# Patient Record
Sex: Male | Born: 1945 | Race: White | Hispanic: No | Marital: Married | Smoking: Former smoker
Health system: Southern US, Community
[De-identification: ages and names within clinical notes are randomized; demographics above are authoritative.]

## PROBLEM LIST (undated history)

## (undated) DIAGNOSIS — K219 Gastro-esophageal reflux disease without esophagitis: Secondary | ICD-10-CM

## (undated) HISTORY — PX: SHOULDER SURGERY: SHX246

## (undated) HISTORY — PX: COLONOSCOPY: SHX174

## (undated) HISTORY — PX: LEG SURGERY: SHX1003

---

## 2007-05-30 ENCOUNTER — Ambulatory Visit: Payer: Self-pay | Admitting: Gastroenterology

## 2010-01-31 ENCOUNTER — Emergency Department: Payer: Self-pay | Admitting: Emergency Medicine

## 2010-02-19 ENCOUNTER — Ambulatory Visit: Payer: Self-pay | Admitting: Orthopedic Surgery

## 2010-12-18 ENCOUNTER — Ambulatory Visit: Payer: Self-pay | Admitting: Gastroenterology

## 2016-03-19 DIAGNOSIS — K635 Polyp of colon: Secondary | ICD-10-CM | POA: Insufficient documentation

## 2016-07-08 ENCOUNTER — Encounter: Payer: Self-pay | Admitting: *Deleted

## 2016-07-09 ENCOUNTER — Ambulatory Visit: Payer: Commercial Managed Care - PPO | Admitting: Certified Registered Nurse Anesthetist

## 2016-07-09 ENCOUNTER — Encounter: Payer: Self-pay | Admitting: Certified Registered Nurse Anesthetist

## 2016-07-09 ENCOUNTER — Ambulatory Visit
Admission: RE | Admit: 2016-07-09 | Discharge: 2016-07-09 | Disposition: A | Discharge status: Home / Self Care | Payer: Commercial Managed Care - PPO | Source: Ambulatory Visit | Attending: Gastroenterology | Admitting: Gastroenterology

## 2016-07-09 ENCOUNTER — Encounter
Admission: RE | Disposition: A | Discharge status: Home / Self Care | Payer: Self-pay | Source: Ambulatory Visit | Attending: Gastroenterology

## 2016-07-09 DIAGNOSIS — K449 Diaphragmatic hernia without obstruction or gangrene: Secondary | ICD-10-CM | POA: Diagnosis not present

## 2016-07-09 DIAGNOSIS — K210 Gastro-esophageal reflux disease with esophagitis: Secondary | ICD-10-CM | POA: Diagnosis not present

## 2016-07-09 DIAGNOSIS — K2950 Unspecified chronic gastritis without bleeding: Secondary | ICD-10-CM | POA: Insufficient documentation

## 2016-07-09 DIAGNOSIS — K5730 Diverticulosis of large intestine without perforation or abscess without bleeding: Secondary | ICD-10-CM | POA: Diagnosis not present

## 2016-07-09 DIAGNOSIS — Z87891 Personal history of nicotine dependence: Secondary | ICD-10-CM | POA: Diagnosis not present

## 2016-07-09 DIAGNOSIS — Z86010 Personal history of colonic polyps: Secondary | ICD-10-CM | POA: Diagnosis not present

## 2016-07-09 DIAGNOSIS — Z6832 Body mass index (BMI) 32.0-32.9, adult: Secondary | ICD-10-CM | POA: Insufficient documentation

## 2016-07-09 DIAGNOSIS — K641 Second degree hemorrhoids: Secondary | ICD-10-CM | POA: Diagnosis not present

## 2016-07-09 DIAGNOSIS — Z800 Family history of malignant neoplasm of digestive organs: Secondary | ICD-10-CM | POA: Insufficient documentation

## 2016-07-09 DIAGNOSIS — K644 Residual hemorrhoidal skin tags: Secondary | ICD-10-CM | POA: Insufficient documentation

## 2016-07-09 DIAGNOSIS — R634 Abnormal weight loss: Secondary | ICD-10-CM | POA: Diagnosis not present

## 2016-07-09 HISTORY — PX: COLONOSCOPY WITH PROPOFOL: SHX5780

## 2016-07-09 HISTORY — DX: Gastro-esophageal reflux disease without esophagitis: K21.9

## 2016-07-09 HISTORY — PX: ESOPHAGOGASTRODUODENOSCOPY (EGD) WITH PROPOFOL: SHX5813

## 2016-07-09 SURGERY — COLONOSCOPY WITH PROPOFOL
Anesthesia: General

## 2016-07-09 MED ORDER — PROPOFOL 10 MG/ML IV BOLUS
INTRAVENOUS | Status: DC | PRN
  Administered 2016-07-09: 30 mg via INTRAVENOUS

## 2016-07-09 MED ORDER — ONDANSETRON HCL 4 MG/2ML IJ SOLN
INTRAMUSCULAR | Status: AC
  Filled 2016-07-09: qty 2

## 2016-07-09 MED ORDER — PROPOFOL 10 MG/ML IV BOLUS
INTRAVENOUS | Status: AC
  Filled 2016-07-09: qty 20

## 2016-07-09 MED ORDER — PROPOFOL 500 MG/50ML IV EMUL
INTRAVENOUS | Status: AC
  Filled 2016-07-09: qty 50

## 2016-07-09 MED ORDER — SODIUM CHLORIDE 0.9 % IV SOLN: INTRAVENOUS | Status: DC

## 2016-07-09 MED ORDER — SODIUM CHLORIDE 0.9 % IV SOLN
INTRAVENOUS | Status: DC
  Administered 2016-07-09: 08:00:00 via INTRAVENOUS

## 2016-07-09 MED ORDER — MIDAZOLAM HCL 2 MG/2ML IJ SOLN
INTRAMUSCULAR | Status: DC | PRN
  Administered 2016-07-09: 2 mg via INTRAVENOUS

## 2016-07-09 MED ORDER — MIDAZOLAM HCL 2 MG/2ML IJ SOLN
INTRAMUSCULAR | Status: AC
  Filled 2016-07-09: qty 2

## 2016-07-09 MED ORDER — ONDANSETRON HCL 4 MG/2ML IJ SOLN
INTRAMUSCULAR | Status: DC | PRN
  Administered 2016-07-09: 4 mg via INTRAVENOUS

## 2016-07-09 MED ORDER — PROPOFOL 500 MG/50ML IV EMUL
INTRAVENOUS | Status: DC | PRN
  Administered 2016-07-09: 140 ug/kg/min via INTRAVENOUS

## 2016-07-09 MED ORDER — LIDOCAINE HCL (CARDIAC) 20 MG/ML IV SOLN
INTRAVENOUS | Status: DC | PRN
  Administered 2016-07-09: 60 mg via INTRAVENOUS

## 2016-07-09 NOTE — H&P (Signed)
Outpatient short stay form Pre-procedure 07/09/2016 8:29 AM Lollie Sails MD  Primary Physician: [facility]  Reason for visit:  EGD and colonoscopy  History of present illness:  Patient is a 71 year old male presenting today as above. He had been having some issues with fair amount of reflux this being daily in nature. He had been placed recently on some omeprazole which is considerably improved his symptoms. She had some weight loss of about 5-10% of his body weight. He is presenting today for EGD and colonoscopy. He tolerated his prep well. He takes no aspirin or blood thinning agents. Patient does have a family history of colon cancer and cervical secondary relatives as well as personal history of colon polyps.    Current Facility-Administered Medications:  .  0.9 %  sodium chloride infusion, , Intravenous, Continuous, Lollie Sails, MD, Last Rate: 20 mL/hr at 07/09/16 0803 .  0.9 %  sodium chloride infusion, , Intravenous, Continuous, Lollie Sails, MD  Prescriptions Prior to Admission  Medication Sig Dispense Refill Last Dose  . omeprazole (PRILOSEC) 20 MG capsule Take 20 mg by mouth daily.   07/07/2016  . polyethylene glycol powder (GLYCOLAX/MIRALAX) powder Take 1 Container by mouth once.   07/08/2016 at Unknown time     No Known Allergies   Past Medical History:  Diagnosis Date  . GERD (gastroesophageal reflux disease)     Review of systems:      Physical Exam    Heart and lungs: Regular rate and rhythm without rub or gallop, lungs are bilaterally clear.    HEENT: Normocephalic atraumatic eyes are anicteric    Other:     Pertinant exam for procedure: Soft nontender nondistended bowel sounds positive normoactive.    Planned proceedures: EGD, colonoscopy and indicated procedures. I have discussed the risks benefits and complications of procedures to include not limited to bleeding, infection, perforation and the risk of sedation and the patient wishes to  proceed.    Lollie Sails, MD Gastroenterology 07/09/2016  8:29 AM

## 2016-07-09 NOTE — Op Note (Signed)
[facility] Gastroenterology Patient Name: Curtis Scott Procedure Date: 07/09/2016 8:33 AM MRN: [id number] Account #: [id number] Date of Birth: 1945/12/29 Admit Type: Outpatient Age: 71 Room: [facility] ENDO ROOM 3 Gender: Male Note Status: Finalized Procedure:            Colonoscopy Indications:          Family history of colon cancer in multiple                        second-degree relatives, Personal history of colonic                        polyps Providers:            Lollie Sails, MD Referring MD:         [place], MD (Referring MD) Medicines:            Monitored Anesthesia Care Complications:        No immediate complications. Procedure:            Pre-Anesthesia Assessment:                       - ASA Grade Assessment: II - A patient with mild                        systemic disease.                       After obtaining informed consent, the colonoscope was                        passed under direct vision. Throughout the procedure,                        the patient's blood pressure, pulse, and oxygen                        saturations were monitored continuously. The                        Colonoscope was introduced through the anus and                        advanced to the the cecum, identified by appendiceal                        orifice and ileocecal valve. The colonoscopy was                        performed with moderate difficulty due to poor bowel                        prep. Successful completion of the procedure was aided                        by lavage. The quality of the bowel preparation was                        good except the sigmoid colon was fair. Findings:      Multiple small and large-mouthed diverticula were found in the sigmoid  colon, descending colon, transverse colon and ascending colon.      Non-bleeding external and internal hemorrhoids were found during       retroflexion, during  digital exam and during anoscopy. The hemorrhoids       were small and Grade II (internal hemorrhoids that prolapse but reduce       spontaneously).      The exam was otherwise without abnormality.      The digital rectal exam was normal. Impression:           - Diverticulosis in the sigmoid colon, in the                        descending colon, in the transverse colon and in the                        ascending colon.                       - Non-bleeding external and internal hemorrhoids.                       - The examination was otherwise normal.                       - No specimens collected. Recommendation:       - Discharge patient to home. Procedure Code(s):    --- Professional ---                       [phone], Colonoscopy, flexible; diagnostic, including                        collection of specimen(s) by brushing or washing, when                        performed (separate procedure) Diagnosis Code(s):    --- Professional ---                       K64.1, Second degree hemorrhoids                       Z80.0, Family history of malignant neoplasm of                        digestive organs                       Z86.010, Personal history of colonic polyps                       K57.30, Diverticulosis of large intestine without                        perforation or abscess without bleeding CPT copyright 2016 American Medical Association. All rights reserved. The codes documented in this report are preliminary and upon coder review may  be revised to meet current compliance requirements. Lollie Sails, MD 07/09/2016 9:25:29 AM This report has been signed electronically. Number of Addenda: 0 Note Initiated On: 07/09/2016 8:33 AM Scope Withdrawal Time: 0 hours 10 minutes 43 seconds  Total Procedure Duration: 0 hours 22 minutes 57 seconds       [facility]

## 2016-07-09 NOTE — Anesthesia Preprocedure Evaluation (Addendum)
Anesthesia Evaluation  Patient identified by MRN, date of birth, ID band Patient awake    Reviewed: Allergy & Precautions, NPO status , Patient's Chart, lab work & pertinent test results  Airway Mallampati: III  TM Distance: >3 FB     Dental  (+) Upper Dentures, Chipped   Pulmonary former smoker,    Pulmonary exam normal        Cardiovascular negative cardio ROS Normal cardiovascular exam     Neuro/Psych negative neurological ROS  negative psych ROS   GI/Hepatic Neg liver ROS, GERD  Medicated,  Endo/Other  negative endocrine ROS  Renal/GU negative Renal ROS  negative genitourinary   Musculoskeletal negative musculoskeletal ROS (+)   Abdominal Normal abdominal exam  (+)   Peds negative pediatric ROS (+)  Hematology negative hematology ROS (+)   Anesthesia Other Findings Past Medical History: No date: GERD (gastroesophageal reflux disease)  Reproductive/Obstetrics                            Anesthesia Physical Anesthesia Plan  ASA: II  Anesthesia Plan: General   Post-op Pain Management:    Induction: Intravenous  Airway Management Planned: Nasal Cannula  Additional Equipment:   Intra-op Plan:   Post-operative Plan:   Informed Consent: I have reviewed the patients History and Physical, chart, labs and discussed the procedure including the risks, benefits and alternatives for the proposed anesthesia with the patient or authorized representative who has indicated his/her understanding and acceptance.   Dental advisory given  Plan Discussed with: CRNA and Surgeon  Anesthesia Plan Comments:         Anesthesia Quick Evaluation

## 2016-07-09 NOTE — Anesthesia Post-op Follow-up Note (Cosign Needed)
Anesthesia QCDR form completed.        

## 2016-07-09 NOTE — Anesthesia Postprocedure Evaluation (Signed)
Anesthesia Post Note  Patient: MASAO JUNKER  Procedure(s) Performed: Procedure(s) (LRB): COLONOSCOPY WITH PROPOFOL (N/A) ESOPHAGOGASTRODUODENOSCOPY (EGD) WITH PROPOFOL (N/A)  Patient location during evaluation: PACU Anesthesia Type: General Level of consciousness: awake and alert and oriented Pain management: pain level controlled Vital Signs Assessment: post-procedure vital signs reviewed and stable Respiratory status: spontaneous breathing Cardiovascular status: blood pressure returned to baseline Anesthetic complications: no     Last Vitals:  Vitals:   07/09/16 0957 07/09/16 1006  BP: (!) 114/96 126/88  Pulse: 81 71  Resp: 18 17  Temp:      Last Pain:  Vitals:   07/09/16 1006  TempSrc:   PainSc: 0-No pain                 Hester Joslin

## 2016-07-09 NOTE — Transfer of Care (Signed)
Immediate Anesthesia Transfer of Care Note  Patient: Curtis Scott  Procedure(s) Performed: Procedure(s): COLONOSCOPY WITH PROPOFOL (N/A) ESOPHAGOGASTRODUODENOSCOPY (EGD) WITH PROPOFOL (N/A)  Patient Location: PACU  Anesthesia Type:General  Level of Consciousness: sedated  Airway & Oxygen Therapy: Patient Spontanous Breathing and Patient connected to nasal cannula oxygen  Post-op Assessment: Report given to RN and Post -op Vital signs reviewed and stable  Post vital signs: Reviewed and stable  Last Vitals:  Vitals:   07/09/16 0746 07/09/16 0927  BP: 137/81 91/68  Pulse: 70 67  Resp: 18 18  Temp: (!) 35.9 C 36.1 C    Last Pain:  Vitals:   07/09/16 0927  TempSrc: Tympanic         Complications: No apparent anesthesia complications

## 2016-07-09 NOTE — Anesthesia Procedure Notes (Signed)
Date/Time: 07/09/2016 8:36 AM Performed by: Johnna Acosta Pre-anesthesia Checklist: Patient identified, Emergency Drugs available, Suction available, Patient being monitored and Timeout performed Patient Re-evaluated:Patient Re-evaluated prior to inductionOxygen Delivery Method: Nasal cannula

## 2016-07-09 NOTE — Op Note (Signed)
[facility] Gastroenterology Patient Name: Noemi Ishmael Procedure Date: 07/09/2016 8:34 AM MRN: [id number] Account #: [id number] Date of Birth: 09/09/1945 Admit Type: Outpatient Age: 71 Room: [facility] ENDO ROOM 3 Gender: Male Note Status: Finalized Procedure:            Upper GI endoscopy Indications:          Heartburn, Gastro-esophageal reflux disease Providers:            Lollie Sails, MD Referring MD:         [place], MD (Referring MD) Medicines:            Monitored Anesthesia Care Complications:        No immediate complications. Procedure:            Pre-Anesthesia Assessment:                       - ASA Grade Assessment: II - A patient with mild                        systemic disease.                       After obtaining informed consent, the endoscope was                        passed under direct vision. Throughout the procedure,                        the patient's blood pressure, pulse, and oxygen                        saturations were monitored continuously. The Endoscope                        was introduced through the mouth, and advanced to the                        third part of duodenum. The upper GI endoscopy was                        accomplished without difficulty. The patient tolerated                        the procedure well. Findings:      The Z-line was irregular. Biopsies were taken with a cold forceps for       histology.      A small hiatal hernia was found. The Z-line was a variable distance from       incisors; the hiatal hernia was sliding. There is some angulation of the       GE junction.      Diffuse mild inflammation characterized by congestion (edema) and       erythema was found in the entire examined stomach. Biopsies were taken       with a cold forceps for histology.      A single 2 mm mucosal papule (nodule) with no bleeding and no stigmata       of recent bleeding was found on the  posterior wall of the gastric       antrum. Biopsies were taken with a cold forceps for histology.  The examined duodenum was normal.      The cardia and gastric fundus were normal on retroflexion. Impression:           - Z-line irregular. Biopsied.                       - Small hiatal hernia.                       - Bile gastritis. Biopsied.                       - A single mucosal papule (nodule) found in the                        stomach. Biopsied.                       - Normal examined duodenum. Recommendation:       - Await pathology results.                       - Telephone GI clinic for pathology results in 1 week.                       - Continue present medications.                       - Use Prilosec (omeprazole) 20 mg PO daily daily. Procedure Code(s):    --- Professional ---                       [phone], Esophagogastroduodenoscopy, flexible, transoral;                        with biopsy, single or multiple Diagnosis Code(s):    --- Professional ---                       K22.8, Other specified diseases of esophagus                       K44.9, Diaphragmatic hernia without obstruction or                        gangrene                       K29.60, Other gastritis without bleeding                       K31.89, Other diseases of stomach and duodenum                       R12, Heartburn                       K21.9, Gastro-esophageal reflux disease without                        esophagitis CPT copyright 2016 American Medical Association. All rights reserved. The codes documented in this report are preliminary and upon coder review may  be revised to meet current compliance requirements. Lollie Sails, MD 07/09/2016 8:56:57 AM This report has been signed electronically. Number of Addenda: 0 Note Initiated On: 07/09/2016  8:34 AM      [facility]

## 2016-07-12 ENCOUNTER — Encounter: Payer: Self-pay | Admitting: Gastroenterology

## 2016-07-13 LAB — SURGICAL PATHOLOGY

## 2017-08-07 ENCOUNTER — Emergency Department: Payer: Commercial Managed Care - PPO

## 2017-08-07 ENCOUNTER — Other Ambulatory Visit: Payer: Self-pay

## 2017-08-07 ENCOUNTER — Encounter: Payer: Self-pay | Admitting: Emergency Medicine

## 2017-08-07 ENCOUNTER — Observation Stay
Admission: EM | Admit: 2017-08-07 | Discharge: 2017-08-10 | Disposition: A | Discharge status: Home / Self Care | Payer: Commercial Managed Care - PPO | Attending: Surgery | Admitting: Surgery

## 2017-08-07 DIAGNOSIS — K219 Gastro-esophageal reflux disease without esophagitis: Secondary | ICD-10-CM | POA: Insufficient documentation

## 2017-08-07 DIAGNOSIS — K760 Fatty (change of) liver, not elsewhere classified: Secondary | ICD-10-CM | POA: Insufficient documentation

## 2017-08-07 DIAGNOSIS — K449 Diaphragmatic hernia without obstruction or gangrene: Secondary | ICD-10-CM | POA: Insufficient documentation

## 2017-08-07 DIAGNOSIS — Z79899 Other long term (current) drug therapy: Secondary | ICD-10-CM | POA: Diagnosis not present

## 2017-08-07 DIAGNOSIS — N400 Enlarged prostate without lower urinary tract symptoms: Secondary | ICD-10-CM | POA: Insufficient documentation

## 2017-08-07 DIAGNOSIS — K8000 Calculus of gallbladder with acute cholecystitis without obstruction: Secondary | ICD-10-CM | POA: Diagnosis present

## 2017-08-07 DIAGNOSIS — K8012 Calculus of gallbladder with acute and chronic cholecystitis without obstruction: Principal | ICD-10-CM | POA: Insufficient documentation

## 2017-08-07 DIAGNOSIS — I700 Atherosclerosis of aorta: Secondary | ICD-10-CM | POA: Insufficient documentation

## 2017-08-07 DIAGNOSIS — Z87891 Personal history of nicotine dependence: Secondary | ICD-10-CM | POA: Diagnosis not present

## 2017-08-07 DIAGNOSIS — K819 Cholecystitis, unspecified: Secondary | ICD-10-CM | POA: Diagnosis present

## 2017-08-07 DIAGNOSIS — M47816 Spondylosis without myelopathy or radiculopathy, lumbar region: Secondary | ICD-10-CM | POA: Diagnosis not present

## 2017-08-07 LAB — COMPREHENSIVE METABOLIC PANEL
ALT: 28 U/L (ref 17–63)
ANION GAP: 11 (ref 5–15)
AST: 25 U/L (ref 15–41)
Albumin: 4.4 g/dL (ref 3.5–5.0)
Alkaline Phosphatase: 59 U/L (ref 38–126)
BILIRUBIN TOTAL: 1.1 mg/dL (ref 0.3–1.2)
BUN: 10 mg/dL (ref 6–20)
CHLORIDE: 101 mmol/L (ref 101–111)
CO2: 27 mmol/L (ref 22–32)
Calcium: 9.5 mg/dL (ref 8.9–10.3)
Creatinine, Ser: 0.82 mg/dL (ref 0.61–1.24)
Glucose, Bld: 114 mg/dL — ABNORMAL HIGH (ref 65–99)
POTASSIUM: 3.7 mmol/L (ref 3.5–5.1)
Sodium: 139 mmol/L (ref 135–145)
TOTAL PROTEIN: 8.1 g/dL (ref 6.5–8.1)

## 2017-08-07 LAB — URINALYSIS, ROUTINE W REFLEX MICROSCOPIC
Bilirubin Urine: NEGATIVE
GLUCOSE, UA: NEGATIVE mg/dL
Hgb urine dipstick: NEGATIVE
KETONES UR: NEGATIVE mg/dL
LEUKOCYTES UA: NEGATIVE
Nitrite: NEGATIVE
PROTEIN: NEGATIVE mg/dL
Specific Gravity, Urine: 1.011 (ref 1.005–1.030)
pH: 7 (ref 5.0–8.0)

## 2017-08-07 LAB — CBC
HEMATOCRIT: 46.3 % (ref 40.0–52.0)
Hemoglobin: 15.8 g/dL (ref 13.0–18.0)
MCH: 30.3 pg (ref 26.0–34.0)
MCHC: 34 g/dL (ref 32.0–36.0)
MCV: 89 fL (ref 80.0–100.0)
PLATELETS: 176 10*3/uL (ref 150–440)
RBC: 5.21 MIL/uL (ref 4.40–5.90)
RDW: 13.2 % (ref 11.5–14.5)
WBC: 14.5 10*3/uL — ABNORMAL HIGH (ref 3.8–10.6)

## 2017-08-07 LAB — LIPASE, BLOOD: LIPASE: 32 U/L (ref 11–51)

## 2017-08-07 MED ORDER — IOPAMIDOL (ISOVUE-300) INJECTION 61%
100.0000 mL | Freq: Once | INTRAVENOUS | Status: AC | PRN
  Administered 2017-08-07: 100 mL via INTRAVENOUS
  Filled 2017-08-07: qty 100

## 2017-08-07 MED ORDER — IOPAMIDOL (ISOVUE-300) INJECTION 61%
30.0000 mL | Freq: Once | INTRAVENOUS | Status: AC
  Administered 2017-08-07: 30 mL via ORAL
  Filled 2017-08-07: qty 30

## 2017-08-07 MED ORDER — MORPHINE SULFATE (PF) 4 MG/ML IV SOLN
4.0000 mg | Freq: Once | INTRAVENOUS | Status: AC
  Administered 2017-08-07: 4 mg via INTRAVENOUS
  Filled 2017-08-07: qty 1

## 2017-08-07 NOTE — ED Notes (Signed)
ED Provider at bedside. 

## 2017-08-07 NOTE — ED Triage Notes (Signed)
Pt arrives POV to triage with c/o constipation x 2 days. Pt reports hx of abdominal surgery x 2 years ago with complications of constipation since that time. Pt is in NAD.

## 2017-08-07 NOTE — ED Notes (Signed)
Patient transported to Ultrasound 

## 2017-08-07 NOTE — Progress Notes (Signed)
Chart reviewed, including imaging studies demonstrating acute calculous cholecystitis with WBC 14.5, discussed with ED physician, Dr. Kerman Passey. Considering few documented comorbidities and no antiplatelet or anticoagulant meds, will admit to surgical service, anticipate cholecystectomy. Full H&P to follow.  -- Marilynne Drivers Rosana Hoes, MD, [place]: [facility] - Partnering for exceptional care. Office: [phone]

## 2017-08-07 NOTE — ED Notes (Signed)
Patient transported to CT 

## 2017-08-07 NOTE — ED Provider Notes (Signed)
[facility] Emergency Department Provider Note  Time seen: 10:09 PM  I have reviewed the triage vital signs and the nursing notes.   HISTORY  Chief Complaint Constipation    HPI Curtis Scott is a 72 y.o. male with a past medical history of gastric reflux, presents to the emergency department for abdominal distention discomfort nausea vomiting.  According to the patient for the past several weeks he has been expensing intermittent abdominal discomfort, has been much more constant over the past 2 to 3 days along with constipation.  States today he began feeling very nauseated with several episodes of vomiting as well.  Denies any dysuria.  Denies hematuria.  Denies fever.  Patient states he had a colonoscopy and endoscopy performed approximately 7 months ago and since that time has had intermittent episodes of constipation and abdominal pain.  Describes his abdominal pain is an 8/10 currently.  Dull aching pain.   Past Medical History:  Diagnosis Date  . GERD (gastroesophageal reflux disease)     There are no active problems to display for this patient.   Past Surgical History:  Procedure Laterality Date  . COLONOSCOPY    . COLONOSCOPY WITH PROPOFOL N/A 07/09/2016   Procedure: COLONOSCOPY WITH PROPOFOL;  Surgeon: Lollie Sails, MD;  Location: [facility] ENDOSCOPY;  Service: Endoscopy;  Laterality: N/A;  . ESOPHAGOGASTRODUODENOSCOPY (EGD) WITH PROPOFOL N/A 07/09/2016   Procedure: ESOPHAGOGASTRODUODENOSCOPY (EGD) WITH PROPOFOL;  Surgeon: Lollie Sails, MD;  Location: [facility] ENDOSCOPY;  Service: Endoscopy;  Laterality: N/A;  . LEG SURGERY Right   . SHOULDER SURGERY Right     Prior to Admission medications   Medication Sig Start Date End Date Taking? Authorizing Provider  omeprazole (PRILOSEC) 20 MG capsule Take 20 mg by mouth daily.    [provider]  polyethylene glycol powder (GLYCOLAX/MIRALAX) powder Take 1 Container by mouth once.     [provider]    No Known Allergies  No family history on file.  Social History Social History   Tobacco Use  . Smoking status: Former Research scientist (life sciences)  . Smokeless tobacco: Never Used  Substance Use Topics  . Alcohol use: No  . Drug use: No    Review of Systems Constitutional: Negative for fever. Eyes: Negative for visual complaints ENT: Negative for recent illness/congestion Cardiovascular: Negative for chest pain. Respiratory: Negative for shortness of breath. Gastrointestinal: Abdominal discomfort, distention.  Positive for nausea vomiting.  Positive for constipation x2 to 3 days. Genitourinary: Negative for urinary compaints Musculoskeletal: Negative for musculoskeletal complaints Skin: Negative for skin complaints  Neurological: Negative for headache All other ROS negative  ____________________________________________   PHYSICAL EXAM:  VITAL SIGNS: ED Triage Vitals  Enc Vitals Group     BP 08/07/17 1914 134/66     Pulse Rate 08/07/17 1914 91     Resp 08/07/17 1914 (!) 24     Temp 08/07/17 1914 97.9 F (36.6 C)     Temp Source 08/07/17 1914 Oral     SpO2 08/07/17 1914 96 %     Weight 08/07/17 1914 230 lb (104.3 kg)     Height 08/07/17 1914 5\' 10"  (1.778 m)     Head Circumference --      Peak Flow --      Pain Score 08/07/17 1913 10     Pain Loc --      Pain Edu? --      Excl. in [place]? --    Constitutional: Alert and oriented. Well appearing and  in no distress. Eyes: Normal exam ENT   Head: Normocephalic and atraumatic.   Mouth/Throat: Mucous membranes are moist. Cardiovascular: Normal rate, regular rhythm. No murmur Respiratory: Normal respiratory effort without tachypnea nor retractions. Breath sounds are clear Gastrointestinal: Soft, mild abdominal distention.  Mild diffuse tenderness to palpation, tympanic percussion.  No rebound or guarding. Musculoskeletal: Nontender with normal range of motion in all extremities. Neurologic:  Normal  speech and language. No gross focal neurologic deficits Skin:  Skin is warm, dry and intact.  Psychiatric: Mood and affect are normal.   ____________________________________________   RADIOLOGY  IMPRESSION: 1. Gallstones with stone in the gallbladder neck and pericholecystic inflammation, highly suspicious for acute cholecystitis. 2. Equivocal wall thickening of the ascending and proximal transverse colon versus nondistention. 3. Enteric contrast in the distal esophagus may be delayed transit or reflux. Small hiatal hernia. 4. Mild hepatic steatosis. Enlarged prostate gland. 5. Aortic Atherosclerosis (ICD10-I70.0).  ____________________________________________   INITIAL IMPRESSION / ASSESSMENT AND PLAN / ED COURSE  Pertinent labs & imaging results that were available during my care of the patient were reviewed by me and considered in my medical decision making (see chart for details).  Patient presents to the emergency department for abdominal distention discomfort 2 to 3 days of constipation.  Patient is still able to pass gas.  Differential would include small bowel obstruction, partial bowel obstruction, gastroenteritis, ileus, infectious etiology.  We will check labs, obtain a CT scan, treat pain and nausea and closely monitor.  Overall the patient appears well.  Lab work has resulted showing a moderate leukocytosis of 14,000 otherwise largely within normal limits.  Mild diffuse tenderness on exam.  CT scan pending.  CT shows gallstones within the gallbladder neck and pericholecystic fluid likely acute cholecystitis will obtain an ultrasound to confirm.  I discussed the patient with Dr. Rosana Hoes of general surgery will be admitting to his service.  I discussed the results with the patient he is agreeable with this plan of care.  ____________________________________________   FINAL CLINICAL IMPRESSION(S) / ED DIAGNOSES  Cholecystitis    Harvest Dark, MD 08/07/17  2251

## 2017-08-08 ENCOUNTER — Encounter: Payer: Self-pay | Admitting: Anesthesiology

## 2017-08-08 ENCOUNTER — Observation Stay: Payer: Commercial Managed Care - PPO | Admitting: Certified Registered Nurse Anesthetist

## 2017-08-08 ENCOUNTER — Encounter
Admission: EM | Disposition: A | Discharge status: Home / Self Care | Payer: Self-pay | Source: Home / Self Care | Attending: Emergency Medicine

## 2017-08-08 DIAGNOSIS — K8000 Calculus of gallbladder with acute cholecystitis without obstruction: Secondary | ICD-10-CM | POA: Diagnosis not present

## 2017-08-08 DIAGNOSIS — K8012 Calculus of gallbladder with acute and chronic cholecystitis without obstruction: Secondary | ICD-10-CM | POA: Diagnosis not present

## 2017-08-08 HISTORY — PX: CHOLECYSTECTOMY: SHX55

## 2017-08-08 LAB — SURGICAL PCR SCREEN
MRSA, PCR: NEGATIVE
STAPHYLOCOCCUS AUREUS: NEGATIVE

## 2017-08-08 LAB — CBC
HEMATOCRIT: 40.8 % (ref 40.0–52.0)
HEMOGLOBIN: 14.2 g/dL (ref 13.0–18.0)
MCH: 31 pg (ref 26.0–34.0)
MCHC: 34.9 g/dL (ref 32.0–36.0)
MCV: 88.8 fL (ref 80.0–100.0)
Platelets: 140 10*3/uL — ABNORMAL LOW (ref 150–440)
RBC: 4.6 MIL/uL (ref 4.40–5.90)
RDW: 13.2 % (ref 11.5–14.5)
WBC: 9.9 10*3/uL (ref 3.8–10.6)

## 2017-08-08 LAB — COMPREHENSIVE METABOLIC PANEL
ALK PHOS: 47 U/L (ref 38–126)
ALT: 21 U/L (ref 17–63)
AST: 19 U/L (ref 15–41)
Albumin: 3.6 g/dL (ref 3.5–5.0)
Anion gap: 8 (ref 5–15)
BILIRUBIN TOTAL: 1 mg/dL (ref 0.3–1.2)
BUN: 11 mg/dL (ref 6–20)
CALCIUM: 8.2 mg/dL — AB (ref 8.9–10.3)
CO2: 25 mmol/L (ref 22–32)
CREATININE: 0.83 mg/dL (ref 0.61–1.24)
Chloride: 104 mmol/L (ref 101–111)
Glucose, Bld: 134 mg/dL — ABNORMAL HIGH (ref 65–99)
Potassium: 3.4 mmol/L — ABNORMAL LOW (ref 3.5–5.1)
SODIUM: 137 mmol/L (ref 135–145)
TOTAL PROTEIN: 6.5 g/dL (ref 6.5–8.1)

## 2017-08-08 SURGERY — LAPAROSCOPIC CHOLECYSTECTOMY
Anesthesia: General | Wound class: Clean Contaminated

## 2017-08-08 MED ORDER — PROPOFOL 10 MG/ML IV BOLUS
INTRAVENOUS | Status: AC
  Filled 2017-08-08: qty 20

## 2017-08-08 MED ORDER — ENOXAPARIN SODIUM 40 MG/0.4ML ~~LOC~~ SOLN
40.0000 mg | SUBCUTANEOUS | Status: DC
  Administered 2017-08-08 – 2017-08-09 (×3): 40 mg via SUBCUTANEOUS
  Filled 2017-08-08 (×3): qty 0.4

## 2017-08-08 MED ORDER — DEXTROSE IN LACTATED RINGERS 5 % IV SOLN
INTRAVENOUS | Status: DC
  Administered 2017-08-08: 01:00:00 via INTRAVENOUS

## 2017-08-08 MED ORDER — HYDROCODONE-ACETAMINOPHEN 5-325 MG PO TABS: 1.0000 | ORAL_TABLET | ORAL | Status: DC | PRN

## 2017-08-08 MED ORDER — SEVOFLURANE IN SOLN
RESPIRATORY_TRACT | Status: AC
  Filled 2017-08-08: qty 250

## 2017-08-08 MED ORDER — BUPIVACAINE-EPINEPHRINE (PF) 0.25% -1:200000 IJ SOLN
INTRAMUSCULAR | Status: DC | PRN
  Administered 2017-08-08: 30 mL via PERINEURAL

## 2017-08-08 MED ORDER — SODIUM CHLORIDE 0.9 % IV SOLN
2.0000 g | INTRAVENOUS | Status: DC
  Administered 2017-08-08: 2 g via INTRAVENOUS
  Filled 2017-08-08: qty 20
  Filled 2017-08-08: qty 2

## 2017-08-08 MED ORDER — LACTATED RINGERS IV SOLN
INTRAVENOUS | Status: DC
  Administered 2017-08-08 – 2017-08-09 (×2): via INTRAVENOUS

## 2017-08-08 MED ORDER — PROPOFOL 10 MG/ML IV BOLUS
INTRAVENOUS | Status: DC | PRN
  Administered 2017-08-08: 150 mg via INTRAVENOUS

## 2017-08-08 MED ORDER — LIDOCAINE HCL (CARDIAC) PF 100 MG/5ML IV SOSY
PREFILLED_SYRINGE | INTRAVENOUS | Status: DC | PRN
  Administered 2017-08-08: 100 mg via INTRAVENOUS

## 2017-08-08 MED ORDER — ONDANSETRON HCL 4 MG/2ML IJ SOLN
INTRAMUSCULAR | Status: AC
  Filled 2017-08-08: qty 2

## 2017-08-08 MED ORDER — MIDAZOLAM HCL 2 MG/2ML IJ SOLN
INTRAMUSCULAR | Status: AC
  Filled 2017-08-08: qty 2

## 2017-08-08 MED ORDER — DEXAMETHASONE SODIUM PHOSPHATE 10 MG/ML IJ SOLN
INTRAMUSCULAR | Status: AC
  Filled 2017-08-08: qty 1

## 2017-08-08 MED ORDER — KETOROLAC TROMETHAMINE 30 MG/ML IJ SOLN
INTRAMUSCULAR | Status: AC
  Filled 2017-08-08: qty 1

## 2017-08-08 MED ORDER — SODIUM CHLORIDE 0.9 % IV SOLN
3.0000 g | Freq: Four times a day (QID) | INTRAVENOUS | Status: DC
  Administered 2017-08-08 – 2017-08-10 (×7): 3 g via INTRAVENOUS
  Filled 2017-08-08 (×10): qty 3

## 2017-08-08 MED ORDER — ONDANSETRON HCL 4 MG/2ML IJ SOLN
4.0000 mg | Freq: Four times a day (QID) | INTRAMUSCULAR | Status: DC | PRN
  Administered 2017-08-08: 4 mg via INTRAVENOUS

## 2017-08-08 MED ORDER — FENTANYL CITRATE (PF) 100 MCG/2ML IJ SOLN
INTRAMUSCULAR | Status: DC | PRN
  Administered 2017-08-08: 50 ug via INTRAVENOUS

## 2017-08-08 MED ORDER — FENTANYL CITRATE (PF) 100 MCG/2ML IJ SOLN
INTRAMUSCULAR | Status: AC
  Filled 2017-08-08: qty 2

## 2017-08-08 MED ORDER — KETOROLAC TROMETHAMINE 30 MG/ML IJ SOLN
15.0000 mg | Freq: Once | INTRAMUSCULAR | Status: AC
  Administered 2017-08-08: 15 mg via INTRAVENOUS
  Filled 2017-08-08: qty 1

## 2017-08-08 MED ORDER — KETOROLAC TROMETHAMINE 30 MG/ML IJ SOLN
INTRAMUSCULAR | Status: DC | PRN
  Administered 2017-08-08: 15 mg via INTRAVENOUS

## 2017-08-08 MED ORDER — ONDANSETRON HCL 4 MG/2ML IJ SOLN: 4.0000 mg | Freq: Once | INTRAMUSCULAR | Status: DC | PRN

## 2017-08-08 MED ORDER — ONDANSETRON 4 MG PO TBDP: 4.0000 mg | ORAL_TABLET | Freq: Four times a day (QID) | ORAL | Status: DC | PRN

## 2017-08-08 MED ORDER — SUCCINYLCHOLINE CHLORIDE 20 MG/ML IJ SOLN
INTRAMUSCULAR | Status: AC
  Filled 2017-08-08: qty 1

## 2017-08-08 MED ORDER — SUCCINYLCHOLINE CHLORIDE 20 MG/ML IJ SOLN
INTRAMUSCULAR | Status: DC | PRN
  Administered 2017-08-08: 100 mg via INTRAVENOUS

## 2017-08-08 MED ORDER — MUPIROCIN 2 % EX OINT
1.0000 "application " | TOPICAL_OINTMENT | Freq: Two times a day (BID) | CUTANEOUS | Status: DC
  Filled 2017-08-08: qty 22

## 2017-08-08 MED ORDER — MORPHINE SULFATE (PF) 2 MG/ML IV SOLN
2.0000 mg | INTRAVENOUS | Status: DC | PRN
  Administered 2017-08-08: 2 mg via INTRAVENOUS
  Filled 2017-08-08: qty 1

## 2017-08-08 MED ORDER — BUPIVACAINE-EPINEPHRINE (PF) 0.25% -1:200000 IJ SOLN
INTRAMUSCULAR | Status: AC
  Filled 2017-08-08: qty 30

## 2017-08-08 MED ORDER — LACTATED RINGERS IV SOLN
INTRAVENOUS | Status: DC | PRN
  Administered 2017-08-08: 13:00:00 via INTRAVENOUS

## 2017-08-08 MED ORDER — FENTANYL CITRATE (PF) 100 MCG/2ML IJ SOLN: 25.0000 ug | INTRAMUSCULAR | Status: DC | PRN

## 2017-08-08 MED ORDER — ROCURONIUM BROMIDE 100 MG/10ML IV SOLN
INTRAVENOUS | Status: DC | PRN
  Administered 2017-08-08: 5 mg via INTRAVENOUS

## 2017-08-08 MED ORDER — MIDAZOLAM HCL 2 MG/2ML IJ SOLN
INTRAMUSCULAR | Status: DC | PRN
  Administered 2017-08-08: 1 mg via INTRAVENOUS

## 2017-08-08 MED ORDER — SUGAMMADEX SODIUM 200 MG/2ML IV SOLN
INTRAVENOUS | Status: AC
  Filled 2017-08-08: qty 2

## 2017-08-08 MED ORDER — MORPHINE SULFATE (PF) 2 MG/ML IV SOLN: 2.0000 mg | INTRAVENOUS | Status: DC | PRN

## 2017-08-08 MED ORDER — DEXAMETHASONE SODIUM PHOSPHATE 10 MG/ML IJ SOLN
INTRAMUSCULAR | Status: DC | PRN
  Administered 2017-08-08: 5 mg via INTRAVENOUS

## 2017-08-08 MED ORDER — FAMOTIDINE IN NACL 20-0.9 MG/50ML-% IV SOLN
20.0000 mg | Freq: Two times a day (BID) | INTRAVENOUS | Status: DC
  Administered 2017-08-08 – 2017-08-09 (×5): 20 mg via INTRAVENOUS
  Filled 2017-08-08 (×6): qty 50

## 2017-08-08 MED ORDER — LIDOCAINE HCL (PF) 2 % IJ SOLN
INTRAMUSCULAR | Status: AC
  Filled 2017-08-08: qty 10

## 2017-08-08 MED ORDER — ROCURONIUM BROMIDE 50 MG/5ML IV SOLN
INTRAVENOUS | Status: AC
  Filled 2017-08-08: qty 1

## 2017-08-08 SURGICAL SUPPLY — 43 items
ADHESIVE MASTISOL STRL (MISCELLANEOUS) ×2 IMPLANT
APPLIER CLIP ROT 10 11.4 M/L (STAPLE) ×2
BLADE SURG SZ11 CARB STEEL (BLADE) ×2 IMPLANT
CANISTER SUCT 1200ML W/VALVE (MISCELLANEOUS) ×2 IMPLANT
CATH CHOLANGI 4FR 420404F (CATHETERS) IMPLANT
CHLORAPREP W/TINT 26ML (MISCELLANEOUS) ×2 IMPLANT
CLIP APPLIE ROT 10 11.4 M/L (STAPLE) ×1 IMPLANT
CONRAY 60ML FOR OR (MISCELLANEOUS) IMPLANT
DRAPE C-ARM XRAY 36X54 (DRAPES) IMPLANT
ELECT REM PT RETURN 9FT ADLT (ELECTROSURGICAL) ×2
ELECTRODE REM PT RTRN 9FT ADLT (ELECTROSURGICAL) ×1 IMPLANT
GLOVE BIO SURGEON STRL SZ8 (GLOVE) ×2 IMPLANT
GOWN STRL REUS W/ TWL LRG LVL3 (GOWN DISPOSABLE) ×4 IMPLANT
GOWN STRL REUS W/TWL LRG LVL3 (GOWN DISPOSABLE) ×4
IRRIGATION STRYKERFLOW (MISCELLANEOUS) ×1 IMPLANT
IRRIGATOR STRYKERFLOW (MISCELLANEOUS) ×2
IV CATH ANGIO 12GX3 LT BLUE (NEEDLE) IMPLANT
IV NS 1000ML (IV SOLUTION) ×1
IV NS 1000ML BAXH (IV SOLUTION) ×1 IMPLANT
JACKSON PRATT 10 (INSTRUMENTS) ×2 IMPLANT
KIT TURNOVER KIT A (KITS) ×2 IMPLANT
LABEL OR SOLS (LABEL) ×2 IMPLANT
NEEDLE HYPO 22GX1.5 SAFETY (NEEDLE) ×2 IMPLANT
NEEDLE VERESS 14GA 120MM (NEEDLE) ×2 IMPLANT
NS IRRIG 500ML POUR BTL (IV SOLUTION) ×2 IMPLANT
PACK LAP CHOLECYSTECTOMY (MISCELLANEOUS) ×2 IMPLANT
POUCH SPECIMEN RETRIEVAL 10MM (ENDOMECHANICALS) ×2 IMPLANT
SCISSORS METZENBAUM CVD 33 (INSTRUMENTS) ×2 IMPLANT
SLEEVE ENDOPATH XCEL 5M (ENDOMECHANICALS) ×4 IMPLANT
SPONGE DRAIN TRACH 4X4 STRL 2S (GAUZE/BANDAGES/DRESSINGS) ×2 IMPLANT
SPONGE GAUZE 2X2 8PLY STRL LF (GAUZE/BANDAGES/DRESSINGS) ×8 IMPLANT
SPONGE LAP 18X18 RF (DISPOSABLE) ×2 IMPLANT
SPONGE VERSALON 4X4 4PLY (MISCELLANEOUS) IMPLANT
STRIP CLOSURE SKIN 1/2X4 (GAUZE/BANDAGES/DRESSINGS) ×2 IMPLANT
SUT ETHILON 3-0 (SUTURE) ×2 IMPLANT
SUT MNCRL 4-0 (SUTURE) ×1
SUT MNCRL 4-0 27XMFL (SUTURE) ×1
SUT VICRYL 0 AB UR-6 (SUTURE) ×2 IMPLANT
SUTURE MNCRL 4-0 27XMF (SUTURE) ×1 IMPLANT
SYR 20CC LL (SYRINGE) ×2 IMPLANT
TROCAR XCEL NON-BLD 11X100MML (ENDOMECHANICALS) ×2 IMPLANT
TROCAR XCEL NON-BLD 5MMX100MML (ENDOMECHANICALS) ×2 IMPLANT
TUBING INSUFFLATION (TUBING) ×2 IMPLANT

## 2017-08-08 NOTE — Progress Notes (Signed)
Preoperative Review   Patient is met in the preoperative holding area. The history is reviewed in the chart and with the patient. I personally reviewed the options and rationale as well as the risks of this procedure that have been previously discussed with the patient. All questions asked by the patient and/or family were answered to their satisfaction.  Patient agrees to proceed with this procedure at this time.  Richard E Cooper M.D. FACS  

## 2017-08-08 NOTE — Anesthesia Post-op Follow-up Note (Signed)
Anesthesia QCDR form completed.        

## 2017-08-08 NOTE — Progress Notes (Signed)
Patient sent to the OR via bed.  Wife is with him

## 2017-08-08 NOTE — Anesthesia Preprocedure Evaluation (Signed)
Anesthesia Evaluation  Patient identified by MRN, date of birth, ID band Patient awake    Reviewed: Allergy & Precautions, NPO status , Patient's Chart, lab work & pertinent test results  Airway Mallampati: III  TM Distance: >3 FB     Dental  (+) Upper Dentures, Chipped   Pulmonary former smoker,    Pulmonary exam normal        Cardiovascular negative cardio ROS Normal cardiovascular exam     Neuro/Psych negative neurological ROS  negative psych ROS   GI/Hepatic Neg liver ROS, GERD  Medicated,  Endo/Other  negative endocrine ROS  Renal/GU negative Renal ROS  negative genitourinary   Musculoskeletal negative musculoskeletal ROS (+)   Abdominal Normal abdominal exam  (+)   Peds negative pediatric ROS (+)  Hematology negative hematology ROS (+)   Anesthesia Other Findings Past Medical History: No date: GERD (gastroesophageal reflux disease)  Reproductive/Obstetrics                             Anesthesia Physical  Anesthesia Plan  ASA: II  Anesthesia Plan: General   Post-op Pain Management:    Induction: Intravenous  PONV Risk Score and Plan:   Airway Management Planned: Oral ETT  Additional Equipment:   Intra-op Plan:   Post-operative Plan: Extubation in OR  Informed Consent: I have reviewed the patients History and Physical, chart, labs and discussed the procedure including the risks, benefits and alternatives for the proposed anesthesia with the patient or authorized representative who has indicated his/her understanding and acceptance.   Dental advisory given  Plan Discussed with: CRNA and Surgeon  Anesthesia Plan Comments:         Anesthesia Quick Evaluation

## 2017-08-08 NOTE — Care Management Obs Status (Signed)
[place] NOTIFICATION   Patient Details  Name: MONTFORD BARG MRN: [id number] Date of Birth: [birth year]   Medicare Observation Status Notification Given:  Yes  Given to wife Edd Fabian at bedside since patient is out of surgery.  Akshay Spang A, RN 08/08/2017, 4:34 PM

## 2017-08-08 NOTE — Progress Notes (Signed)
Patient arrived from PACU

## 2017-08-08 NOTE — Care Management Note (Signed)
Case Management Note  Patient Details  Name: Curtis Scott MRN: [id number] Date of Birth: 1945/04/29  Subjective/Objective:   Patient admitted to [facility] under observation status. Patient had laparoscopic cholecystectomy. Spous Curtis Scott [phone] is at bedside and per the patient RNCM may obtain information from her as he has recently had surgery. Patient functions independently at home and is able to complete activities of daily living without issue. Currently uses no DME but does have a walker available if needed from previous knee surgery. No transportation limitations. Medications obtained from [facility] without issue. PCP is with the [facility].                 Action/Plan:  No anticipated RNCM needs. Will continue to follow.  Expected Discharge Date:                  Expected Discharge Plan:     In-House Referral:     Discharge planning Services     Post Acute Care Choice:    Choice offered to:     DME Arranged:    DME Agency:     HH Arranged:    HH Agency:     Status of Service:     If discussed at [employer], dates discussed:    Additional Comments:  Curtis Scott A, RN 08/08/2017, 4:35 PM

## 2017-08-08 NOTE — Anesthesia Postprocedure Evaluation (Signed)
Anesthesia Post Note  Patient: Curtis Scott  Procedure(s) Performed: LAPAROSCOPIC CHOLECYSTECTOMY (N/A )  Patient location during evaluation: PACU Anesthesia Type: General Level of consciousness: awake and alert and oriented Pain management: pain level controlled Vital Signs Assessment: post-procedure vital signs reviewed and stable Respiratory status: spontaneous breathing Cardiovascular status: blood pressure returned to baseline Anesthetic complications: no     Last Vitals:  Vitals:   08/08/17 1519 08/08/17 1534  BP: 110/64 111/64  Pulse: 87 80  Resp: 15 18  Temp:  36.5 C  SpO2: 98% 95%    Last Pain:  Vitals:   08/08/17 1534  TempSrc:   PainSc: 0-No pain                 Chloeann Alfred

## 2017-08-08 NOTE — Op Note (Signed)
Laparoscopic Cholecystectomy  Pre-operative Diagnosis: Acute cholecystitis  Post-operative Diagnosis: Acute cholecystitis with hydrops  Procedure: Laparoscopic cholecystectomy  Surgeon: Jerrol Banana. Burt Knack, MD FACS  Anesthesia: Gen. with endotracheal tube  Assistant: Surgical tech  Procedure Details  The patient was seen again in the Holding Room. The benefits, complications, treatment options, and expected outcomes were discussed with the patient. The risks of bleeding, infection, recurrence of symptoms, failure to resolve symptoms, bile duct damage, bile duct leak, retained common bile duct stone, bowel injury, any of which could require further surgery and/or ERCP, stent, or papillotomy were reviewed with the patient. The likelihood of improving the patient's symptoms with return to their baseline status is good.  The patient and/or family concurred with the proposed plan, giving informed consent.  The patient was taken to Operating Room, identified as Curtis Scott and the procedure verified as Laparoscopic Cholecystectomy.  A Time Out was held and the above information confirmed.  Prior to the induction of general anesthesia, antibiotic prophylaxis was administered. VTE prophylaxis was in place. General endotracheal anesthesia was then administered and tolerated well. After the induction, the abdomen was prepped with Chloraprep and draped in the sterile fashion. The patient was positioned in the supine position.  Local anesthetic  was injected into the skin near the umbilicus and an incision made. The Veress needle was placed. Pneumoperitoneum was then created with CO2 and tolerated well without any adverse changes in the patient's vital signs. A 36mm port was placed in the periumbilical position and the abdominal cavity was explored.  Two 5-mm ports were placed in the right upper quadrant and a 12 mm epigastric port was placed all under direct vision. All skin incisions  were infiltrated  with a local anesthetic agent before making the incision and placing the trocars.   The patient was positioned  in reverse Trendelenburg, tilted slightly to the patient's left.  The gallbladder was identified, the fundus grasped and retracted cephalad. Adhesions were lysed bluntly. The infundibulum was grasped and retracted laterally, exposing the peritoneum overlying the triangle of Calot. This was then divided and exposed in a blunt fashion.  There was a large amount of scar and edema in this area and it was clear that the thickened gallbladder wall was result of an impacted stone in the infundibulum of the gallbladder.  Because the infundibulum of the gallbladder and the impacted stone appeared very close to what was believed to be the common bile duct a portion of the dissection was performed from the midportion or more fundal portion of the gallbladder down to the cystic duct.  This allowed for good visualization that there was no structure behind the gallbladder suggestive of a common hepatic duct.  Small vessels were doubly clipped or singly clipped and divided.  A critical view of the cystic duct and cystic artery was obtained.  The cystic duct was clearly identified and bluntly dissected.   The cystic artery was doubly clipped and divided and then the cystic duct was doubly clipped just below the impacted stone and divided.  A large stone was visualized in the cystic duct infundibular junction.  (The gallbladder was inspected on the back table after passing it out through the epigastric port site with the aid of an Endo Catch bag there was no sign of bile duct injury etc.  The gallbladder wall was extremely thick and scar of fied)  The gallbladder was taken from the gallbladder fossa in a retrograde fashion with the electrocautery. The gallbladder was  removed and placed in an Endocatch bag. The liver bed was irrigated and inspected. Hemostasis was achieved with the electrocautery. Copious irrigation  was utilized and was repeatedly aspirated until clear.  The gallbladder and Endocatch sac were then removed through the epigastric port site.   A 10 mm drain was placed into the foramen of Winslow and brought out through a lateral port site it was held in with 3-0 nylon.  Inspection of the right upper quadrant was performed. No bleeding, bile duct injury or leak, or bowel injury was noted. Pneumoperitoneum was released.  The epigastric port site was closed with figure-of-eight 0 Vicryl sutures. 4-0 subcuticular Monocryl was used to close the skin. Steristrips and Mastisol and sterile dressings were  applied.  The patient was then extubated and brought to the recovery room in stable condition. Sponge, lap, and needle counts were correct at closure and at the conclusion of the case.   Findings: Acute cholecystitis with hydrops  Estimated Blood Loss: 50 cc         Drains: JP x1         Specimens: Gallbladder           Complications: none               Koraima Albertsen E. Burt Knack, MD, FACS

## 2017-08-08 NOTE — Transfer of Care (Signed)
Immediate Anesthesia Transfer of Care Note  Patient: Curtis Scott  Procedure(s) Performed: LAPAROSCOPIC CHOLECYSTECTOMY (N/A )  Patient Location: PACU  Anesthesia Type:General  Level of Consciousness: awake  Airway & Oxygen Therapy: Patient Spontanous Breathing  Post-op Assessment: Report given to RN  Post vital signs: stable  Last Vitals:  Vitals Value Taken Time  BP 121/66 08/08/2017  2:49 PM  Temp 36.6 C 08/08/2017  2:49 PM  Pulse 92 08/08/2017  2:52 PM  Resp 14 08/08/2017  2:51 PM  SpO2 94 % 08/08/2017  2:52 PM  Vitals shown include unvalidated device data.  Last Pain:  Vitals:   08/08/17 1449  TempSrc:   PainSc: (P) Asleep         Complications: No apparent anesthesia complications

## 2017-08-08 NOTE — Anesthesia Procedure Notes (Signed)
Procedure Name: Intubation Date/Time: 08/08/2017 1:34 PM Performed by: Zetta Bills, CRNA Pre-anesthesia Checklist: Patient identified, Emergency Drugs available, Suction available and Patient being monitored Patient Re-evaluated:Patient Re-evaluated prior to induction Oxygen Delivery Method: Circle system utilized Preoxygenation: Pre-oxygenation with 100% oxygen Induction Type: IV induction Laryngoscope Size: Mac and 3 Grade View: Grade I Tube type: Oral Tube size: 7.0 mm Number of attempts: 1 Airway Equipment and Method: Stylet

## 2017-08-08 NOTE — H&P (Signed)
Curtis Scott is an 72 y.o. male.    Chief Complaint: Right upper quadrant abdominal pain  HPI: This is a patient with 3 or 4 episodes in the last 6 months of right upper quadrant abdominal pain he believes is related to his prior EGD and colonoscopy however.  He has known gallstones and has been worked up suggestive of early acute cholecystitis.  This morning he feels better has no nausea vomiting fevers or chills.  He denies taking any medicines except an over-the-counter antiacid.  He denies hypertension cardiac disease or pulmonary disease he was a smoker and drinker stopped drinking in the 90s and stopped smoking 20 years ago.  He is a retired Administrator.  He does not know of any family history significant for gallbladder disease.  Past Medical History:  Diagnosis Date  . GERD (gastroesophageal reflux disease)     Past Surgical History:  Procedure Laterality Date  . COLONOSCOPY    . COLONOSCOPY WITH PROPOFOL N/A 07/09/2016   Procedure: COLONOSCOPY WITH PROPOFOL;  Surgeon: Lollie Sails, MD;  Location: [facility] ENDOSCOPY;  Service: Endoscopy;  Laterality: N/A;  . ESOPHAGOGASTRODUODENOSCOPY (EGD) WITH PROPOFOL N/A 07/09/2016   Procedure: ESOPHAGOGASTRODUODENOSCOPY (EGD) WITH PROPOFOL;  Surgeon: Lollie Sails, MD;  Location: [facility] ENDOSCOPY;  Service: Endoscopy;  Laterality: N/A;  . LEG SURGERY Right   . SHOULDER SURGERY Right     No family history on file. Social History:  reports that he has quit smoking. He has never used smokeless tobacco. He reports that he does not drink alcohol or use drugs.  Allergies: No Known Allergies  Medications Prior to Admission  Medication Sig Dispense Refill  . omeprazole (PRILOSEC) 20 MG capsule Take 20 mg by mouth daily.       Review of Systems  Constitutional: Negative for chills and fever.  HENT: Negative.   Eyes: Negative.   Respiratory: Negative.   Cardiovascular: Negative.   Gastrointestinal: Positive for abdominal pain and  heartburn. Negative for blood in stool, constipation, diarrhea, nausea and vomiting.  Genitourinary: Negative.   Musculoskeletal: Negative.   Skin: Negative.   Neurological: Negative.   Endo/Heme/Allergies: Negative.   Psychiatric/Behavioral: Negative.      Physical Exam:  BP 119/69 (BP Location: Left Arm)   Pulse 81   Temp 98.1 F (36.7 C) (Oral)   Resp 18   Ht '5\' 10"'  (1.778 m)   Wt 225 lb (102.1 kg)   SpO2 97%   BMI 32.28 kg/m   Physical Exam  Constitutional: He is oriented to person, place, and time. He appears well-developed and well-nourished. No distress.  HENT:  Head: Normocephalic and atraumatic.  Eyes: Pupils are equal, round, and reactive to light. EOM are normal. Right eye exhibits no discharge. Left eye exhibits no discharge. No scleral icterus.  Neck: Normal range of motion. Neck supple.  Cardiovascular: Normal rate, regular rhythm and normal heart sounds.  Pulmonary/Chest: Effort normal and breath sounds normal. No respiratory distress.  Abdominal: Soft. He exhibits no distension and no mass. There is tenderness. There is no guarding.  Tenderness right upper quadrant with questionable Murphy sign  Musculoskeletal: Normal range of motion. He exhibits no edema or deformity.  Neurological: He is alert and oriented to person, place, and time.  Skin: Skin is warm and dry. He is not diaphoretic. No erythema.  Psychiatric: He has a normal mood and affect.  Vitals reviewed.       Results for orders placed or performed during the hospital encounter of  08/07/17 (from the past 48 hour(s))  CBC     Status: Abnormal   Collection Time: 08/07/17  8:47 PM  Result Value Ref Range   WBC 14.5 (H) 3.8 - 10.6 K/uL   RBC 5.21 4.40 - 5.90 MIL/uL   Hemoglobin 15.8 13.0 - 18.0 g/dL   HCT 46.3 40.0 - 52.0 %   MCV 89.0 80.0 - 100.0 fL   MCH 30.3 26.0 - 34.0 pg   MCHC 34.0 32.0 - 36.0 g/dL   RDW 13.2 11.5 - 14.5 %   Platelets 176 150 - 440 K/uL    Comment: Performed at  [facility], [address] [place], [place] [address]  Comprehensive metabolic panel     Status: Abnormal   Collection Time: 08/07/17  8:47 PM  Result Value Ref Range   Sodium 139 135 - 145 mmol/L   Potassium 3.7 3.5 - 5.1 mmol/L   Chloride 101 101 - 111 mmol/L   CO2 27 22 - 32 mmol/L   Glucose, Bld 114 (H) 65 - 99 mg/dL   BUN 10 6 - 20 mg/dL   Creatinine, Ser 0.82 0.61 - 1.24 mg/dL   Calcium 9.5 8.9 - 10.3 mg/dL   Total Protein 8.1 6.5 - 8.1 g/dL   Albumin 4.4 3.5 - 5.0 g/dL   AST 25 15 - 41 U/L   ALT 28 17 - 63 U/L   Alkaline Phosphatase 59 38 - 126 U/L   Total Bilirubin 1.1 0.3 - 1.2 mg/dL   GFR calc non Af Amer >60 >60 mL/min   GFR calc Af Amer >60 >60 mL/min    Comment: (NOTE) The eGFR has been calculated using the CKD EPI equation. This calculation has not been validated in all clinical situations. eGFR's persistently <60 mL/min signify possible Chronic Kidney Disease.    Anion gap 11 5 - 15    Comment: Performed at [facility], [address] [place], [place] [address]  Lipase, blood     Status: None   Collection Time: 08/07/17  8:47 PM  Result Value Ref Range   Lipase 32 11 - 51 U/L    Comment: Performed at [facility], [address] [place], [place] [address]  Urinalysis, Routine w reflex microscopic     Status: Abnormal   Collection Time: 08/07/17 10:10 PM  Result Value Ref Range   Color, Urine YELLOW (A) YELLOW   APPearance HAZY (A) CLEAR   Specific Gravity, Urine 1.011 1.005 - 1.030   pH 7.0 5.0 - 8.0   Glucose, UA NEGATIVE NEGATIVE mg/dL   Hgb urine dipstick NEGATIVE NEGATIVE   Bilirubin Urine NEGATIVE NEGATIVE   Ketones, ur NEGATIVE NEGATIVE mg/dL   Protein, ur NEGATIVE NEGATIVE mg/dL   Nitrite NEGATIVE NEGATIVE   Leukocytes, UA NEGATIVE NEGATIVE    Comment: Performed at [facility], [address], [place], [place] [address]  Surgical PCR screen     Status: None   Collection Time: 08/08/17  1:23 AM   Result Value Ref Range   MRSA, PCR NEGATIVE NEGATIVE   Staphylococcus aureus NEGATIVE NEGATIVE    Comment: (NOTE) The Xpert SA Assay (FDA approved for NASAL specimens in patients 38 years of age and older), is one component of a comprehensive surveillance program. It is not intended to diagnose infection nor to guide or monitor treatment. Performed at [facility], [address] [place], [place] [address]   Comprehensive metabolic panel     Status: Abnormal  Collection Time: 08/08/17  4:58 AM  Result Value Ref Range   Sodium 137 135 - 145 mmol/L   Potassium 3.4 (L) 3.5 - 5.1 mmol/L   Chloride 104 101 - 111 mmol/L   CO2 25 22 - 32 mmol/L   Glucose, Bld 134 (H) 65 - 99 mg/dL   BUN 11 6 - 20 mg/dL   Creatinine, Ser 0.83 0.61 - 1.24 mg/dL   Calcium 8.2 (L) 8.9 - 10.3 mg/dL   Total Protein 6.5 6.5 - 8.1 g/dL   Albumin 3.6 3.5 - 5.0 g/dL   AST 19 15 - 41 U/L   ALT 21 17 - 63 U/L   Alkaline Phosphatase 47 38 - 126 U/L   Total Bilirubin 1.0 0.3 - 1.2 mg/dL   GFR calc non Af Amer >60 >60 mL/min   GFR calc Af Amer >60 >60 mL/min    Comment: (NOTE) The eGFR has been calculated using the CKD EPI equation. This calculation has not been validated in all clinical situations. eGFR's persistently <60 mL/min signify possible Chronic Kidney Disease.    Anion gap 8 5 - 15    Comment: Performed at [facility], [address] [place], [place] [address]  CBC     Status: Abnormal   Collection Time: 08/08/17  4:58 AM  Result Value Ref Range   WBC 9.9 3.8 - 10.6 K/uL   RBC 4.60 4.40 - 5.90 MIL/uL   Hemoglobin 14.2 13.0 - 18.0 g/dL   HCT 40.8 40.0 - 52.0 %   MCV 88.8 80.0 - 100.0 fL   MCH 31.0 26.0 - 34.0 pg   MCHC 34.9 32.0 - 36.0 g/dL   RDW 13.2 11.5 - 14.5 %   Platelets 140 (L) 150 - 440 K/uL    Comment: Performed at [facility], [address] [place], [place] [address]   Ct Abdomen Pelvis W Contrast  Result Date: 08/07/2017 CLINICAL DATA:   Abdominal pain and distension.  Nausea. EXAM: CT ABDOMEN AND PELVIS WITH CONTRAST TECHNIQUE: Multidetector CT imaging of the abdomen and pelvis was performed using the standard protocol following bolus administration of intravenous contrast. CONTRAST:  178m ISOVUE-300 IOPAMIDOL (ISOVUE-300) INJECTION 61% COMPARISON:  None. FINDINGS: Lower chest: Right basilar atelectasis, mild motion artifact. Enteric contrast in the distal esophagus. Hepatobiliary: Multiple gallstones within the gallbladder including a stone in the gallbladder neck. Mild gallbladder wall enhancement with pericholecystic fat stranding and small amount of fluid. No visualized choledocholithiasis. Mild diffuse decreased hepatic density consistent with steatosis. No discrete hepatic lesion. Pancreas: No ductal dilatation or inflammation. Spleen: Normal in size without focal abnormality. Adrenals/Urinary Tract: Normal adrenal glands. No hydronephrosis or perinephric edema. Homogeneous renal enhancement with symmetric excretion on delayed phase imaging. Mild prominence of both ureters without stone or obstruction. Urinary bladder is partially distended, mild wall thickening of the dome. No perivesicular stranding. Stomach/Bowel: Enteric contrast in the distal esophagus, small hiatal hernia. Few small bowel loops in the right abdomen are fluid-filled, likely reactive. No evidence of obstruction or small bowel wall thickening. Equivocal wall thickening versus nondistention of the ascending and proximal transverse colon. There is sigmoid colonic tortuosity. No significant diverticular disease. The appendix is not confidently visualized, no pericecal inflammation. Vascular/Lymphatic: Aortic atherosclerosis without aneurysm. No enlarged abdominal or pelvic lymph nodes. Reproductive: Heterogeneous enlarged prostate gland causing mass effect on the bladder base. Prostate volume of approximately 110 cm^3. Other: Fat in the left inguinal canal.  No ascites or  free air. Musculoskeletal: Degenerative change in  the lower lumbar spine. There are no acute or suspicious osseous abnormalities. IMPRESSION: 1. Gallstones with stone in the gallbladder neck and pericholecystic inflammation, highly suspicious for acute cholecystitis. 2. Equivocal wall thickening of the ascending and proximal transverse colon versus nondistention. 3. Enteric contrast in the distal esophagus may be delayed transit or reflux. Small hiatal hernia. 4. Mild hepatic steatosis.  Enlarged prostate gland. 5.  Aortic Atherosclerosis (ICD10-I70.0). Electronically Signed   By: Jeb Levering M.D.   On: 08/07/2017 22:37   US Abdomen Limited Ruq  Result Date: 08/07/2017 CLINICAL DATA:  Upper abdominal pain and distension, nausea. History of hernia repair. EXAM: ULTRASOUND ABDOMEN LIMITED RIGHT UPPER QUADRANT COMPARISON:  CT abdomen and pelvis August 07, 2017 FINDINGS: Gallbladder: Multiple echogenic gallstones measuring to 5 mm, 1 of which is at the gallbladder neck and appears nonmobile. Gallbladder wall thickening at 8 mm. Pericholecystic fluid. No sonographic Murphy's sign elicited. Common bile duct: Diameter: 4 mm Liver: No focal lesion identified. Diffusely increased parenchymal echogenicity. Portal vein is patent on color Doppler imaging with normal direction of blood flow towards the liver. IMPRESSION: 1. Cholelithiasis and sonographic findings of acute cholecystitis, however no sonographic Murphy's sign elicited. 2. Hepatic steatosis. Electronically Signed   By: Elon Alas M.D.   On: 08/07/2017 23:38     Assessment/Plan  This patient with right upper quadrant pain with multiple episodes of the previously spontaneously resolved.  This was started 2 days ago and is improved slightly but still present.  His work-up is shown probable acute cholecystitis.  I recommended laparoscopic cholecystectomy.  We will attempt to schedule today.  The rationale for this approach was discussed as was the  options of observation and discharge with elective follow-up.  He wishes to proceed today.  We will attempt to schedule.  I discussed with him the procedure.  I discussed with him the risk of bleeding infection recurrence of symptoms conversion to an open procedure bile duct damage bile duct leak retained common bile duct stone in it which could require further surgery and/or ERCP stent and papillotomy this is all reviewed for him.  He understood and agreed to proceed.  No family was present.  Discussed with RN.  Florene Glen, MD, FACS

## 2017-08-09 ENCOUNTER — Other Ambulatory Visit: Payer: Self-pay

## 2017-08-09 ENCOUNTER — Encounter: Payer: Self-pay | Admitting: Surgery

## 2017-08-09 DIAGNOSIS — K8012 Calculus of gallbladder with acute and chronic cholecystitis without obstruction: Secondary | ICD-10-CM | POA: Diagnosis not present

## 2017-08-09 LAB — CBC WITH DIFFERENTIAL/PLATELET
BASOS ABS: 0 10*3/uL (ref 0–0.1)
BASOS PCT: 0 %
EOS PCT: 0 %
Eosinophils Absolute: 0 10*3/uL (ref 0–0.7)
HCT: 38.5 % — ABNORMAL LOW (ref 40.0–52.0)
HEMOGLOBIN: 13.3 g/dL (ref 13.0–18.0)
Lymphocytes Relative: 8 %
Lymphs Abs: 1 10*3/uL (ref 1.0–3.6)
MCH: 30.9 pg (ref 26.0–34.0)
MCHC: 34.5 g/dL (ref 32.0–36.0)
MCV: 89.7 fL (ref 80.0–100.0)
Monocytes Absolute: 0.7 10*3/uL (ref 0.2–1.0)
Monocytes Relative: 6 %
NEUTROS PCT: 86 %
Neutro Abs: 10.7 10*3/uL — ABNORMAL HIGH (ref 1.4–6.5)
Platelets: 155 10*3/uL (ref 150–440)
RBC: 4.29 MIL/uL — AB (ref 4.40–5.90)
RDW: 13.3 % (ref 11.5–14.5)
WBC: 12.4 10*3/uL — AB (ref 3.8–10.6)

## 2017-08-09 LAB — COMPREHENSIVE METABOLIC PANEL
ALBUMIN: 3.5 g/dL (ref 3.5–5.0)
ALK PHOS: 45 U/L (ref 38–126)
ALT: 42 U/L (ref 17–63)
AST: 28 U/L (ref 15–41)
Anion gap: 8 (ref 5–15)
BUN: 11 mg/dL (ref 6–20)
CHLORIDE: 107 mmol/L (ref 101–111)
CO2: 25 mmol/L (ref 22–32)
Calcium: 8.4 mg/dL — ABNORMAL LOW (ref 8.9–10.3)
Creatinine, Ser: 0.89 mg/dL (ref 0.61–1.24)
GFR calc non Af Amer: >60 mL/min (ref >60)
GLUCOSE: 124 mg/dL — AB (ref 65–99)
Potassium: 4.4 mmol/L (ref 3.5–5.1)
SODIUM: 140 mmol/L (ref 135–145)
Total Bilirubin: 1.1 mg/dL (ref 0.3–1.2)
Total Protein: 6.7 g/dL (ref 6.5–8.1)

## 2017-08-09 NOTE — Progress Notes (Signed)
Patient expressed no spiritual needs, but enjoys conversation. Chaplain offered active listening and supportive presence.

## 2017-08-09 NOTE — Progress Notes (Signed)
Patient not in room.  We will see later

## 2017-08-09 NOTE — Progress Notes (Signed)
1 Day Post-Op  Subjective: Patient feels well following laparoscopic cholecystectomy for acute cholecystitis.  He wants to advance his diet has no nausea vomiting fevers or chills  Objective: Vital signs in last 24 hours: Temp:  [97.9 F (36.6 C)-98.5 F (36.9 C)] 98.1 F (36.7 C) (06/04 1227) Pulse Rate:  [76-97] 76 (06/04 1227) Resp:  [18-20] 18 (06/04 1227) BP: (110-142)/(67-87) 142/87 (06/04 1227) SpO2:  [94 %-100 %] 100 % (06/04 1227) Last BM Date: 08/07/17  Intake/Output from previous day: 06/03 0701 - 06/04 0700 In: 2851.7 [P.O.:720; I.V.:2031.7; IV Piggyback:100] Out: 2297 [Urine:2225; Drains:67; Blood:5] Intake/Output this shift: Total I/O In: 2235.4 [P.O.:880; I.V.:1355.4] Out: 5681 [EXNTZ:0017; Drains:10]  Physical exam:  No bile in drain wounds are dressed soft abdomen no icterus  Lab Results: CBC  Recent Labs    08/08/17 0458 08/09/17 0436  WBC 9.9 12.4*  HGB 14.2 13.3  HCT 40.8 38.5*  PLT 140* 155   BMET Recent Labs    08/08/17 0458 08/09/17 0436  NA 137 140  K 3.4* 4.4  CL 104 107  CO2 25 25  GLUCOSE 134* 124*  BUN 11 11  CREATININE 0.83 0.89  CALCIUM 8.2* 8.4*   PT/INR No results for input(s): LABPROT, INR in the last 72 hours. ABG No results for input(s): PHART, HCO3 in the last 72 hours.  Invalid input(s): PCO2, PO2  Studies/Results: Ct Abdomen Pelvis W Contrast  Result Date: 08/07/2017 CLINICAL DATA:  Abdominal pain and distension.  Nausea. EXAM: CT ABDOMEN AND PELVIS WITH CONTRAST TECHNIQUE: Multidetector CT imaging of the abdomen and pelvis was performed using the standard protocol following bolus administration of intravenous contrast. CONTRAST:  158mL ISOVUE-300 IOPAMIDOL (ISOVUE-300) INJECTION 61% COMPARISON:  None. FINDINGS: Lower chest: Right basilar atelectasis, mild motion artifact. Enteric contrast in the distal esophagus. Hepatobiliary: Multiple gallstones within the gallbladder including a stone in the gallbladder neck.  Mild gallbladder wall enhancement with pericholecystic fat stranding and small amount of fluid. No visualized choledocholithiasis. Mild diffuse decreased hepatic density consistent with steatosis. No discrete hepatic lesion. Pancreas: No ductal dilatation or inflammation. Spleen: Normal in size without focal abnormality. Adrenals/Urinary Tract: Normal adrenal glands. No hydronephrosis or perinephric edema. Homogeneous renal enhancement with symmetric excretion on delayed phase imaging. Mild prominence of both ureters without stone or obstruction. Urinary bladder is partially distended, mild wall thickening of the dome. No perivesicular stranding. Stomach/Bowel: Enteric contrast in the distal esophagus, small hiatal hernia. Few small bowel loops in the right abdomen are fluid-filled, likely reactive. No evidence of obstruction or small bowel wall thickening. Equivocal wall thickening versus nondistention of the ascending and proximal transverse colon. There is sigmoid colonic tortuosity. No significant diverticular disease. The appendix is not confidently visualized, no pericecal inflammation. Vascular/Lymphatic: Aortic atherosclerosis without aneurysm. No enlarged abdominal or pelvic lymph nodes. Reproductive: Heterogeneous enlarged prostate gland causing mass effect on the bladder base. Prostate volume of approximately 110 cm^3. Other: Fat in the left inguinal canal.  No ascites or free air. Musculoskeletal: Degenerative change in the lower lumbar spine. There are no acute or suspicious osseous abnormalities. IMPRESSION: 1. Gallstones with stone in the gallbladder neck and pericholecystic inflammation, highly suspicious for acute cholecystitis. 2. Equivocal wall thickening of the ascending and proximal transverse colon versus nondistention. 3. Enteric contrast in the distal esophagus may be delayed transit or reflux. Small hiatal hernia. 4. Mild hepatic steatosis.  Enlarged prostate gland. 5.  Aortic  Atherosclerosis (ICD10-I70.0). Electronically Signed   By: Jeb Levering M.D.   On:  08/07/2017 22:37   US Abdomen Limited Ruq  Result Date: 08/07/2017 CLINICAL DATA:  Upper abdominal pain and distension, nausea. History of hernia repair. EXAM: ULTRASOUND ABDOMEN LIMITED RIGHT UPPER QUADRANT COMPARISON:  CT abdomen and pelvis August 07, 2017 FINDINGS: Gallbladder: Multiple echogenic gallstones measuring to 5 mm, 1 of which is at the gallbladder neck and appears nonmobile. Gallbladder wall thickening at 8 mm. Pericholecystic fluid. No sonographic Murphy's sign elicited. Common bile duct: Diameter: 4 mm Liver: No focal lesion identified. Diffusely increased parenchymal echogenicity. Portal vein is patent on color Doppler imaging with normal direction of blood flow towards the liver. IMPRESSION: 1. Cholelithiasis and sonographic findings of acute cholecystitis, however no sonographic Murphy's sign elicited. 2. Hepatic steatosis. Electronically Signed   By: Elon Alas M.D.   On: 08/07/2017 23:38    Anti-infectives: Anti-infectives (From admission, onward)   Start     Dose/Rate Route Frequency Ordered Stop   08/08/17 1600  Ampicillin-Sulbactam (UNASYN) 3 g in sodium chloride 0.9 % 100 mL IVPB     3 g 200 mL/hr over 30 Minutes Intravenous Every 6 hours 08/08/17 1555     08/08/17 0015  cefTRIAXone (ROCEPHIN) 2 g in sodium chloride 0.9 % 100 mL IVPB  Status:  Discontinued     2 g 200 mL/hr over 30 Minutes Intravenous Every 24 hours 08/08/17 0009 08/08/17 1555      Assessment/Plan: s/p Procedure(s): LAPAROSCOPIC CHOLECYSTECTOMY   Labs reviewed.  Patient doing very well will advance to regular diet likely home tomorrow and DC drain.  Florene Glen, MD, FACS  08/09/2017

## 2017-08-10 DIAGNOSIS — K8012 Calculus of gallbladder with acute and chronic cholecystitis without obstruction: Secondary | ICD-10-CM | POA: Diagnosis not present

## 2017-08-10 MED ORDER — HYDROCODONE-ACETAMINOPHEN 5-325 MG PO TABS: 1.0000 | ORAL_TABLET | Freq: Four times a day (QID) | ORAL | 0 refills | Status: DC | PRN

## 2017-08-10 MED ORDER — AMOXICILLIN-POT CLAVULANATE 875-125 MG PO TABS: 1.0000 | ORAL_TABLET | Freq: Two times a day (BID) | ORAL | 1 refills | Status: DC

## 2017-08-10 NOTE — Discharge Summary (Signed)
Physician Discharge Summary  Patient ID: Curtis Scott MRN: [id number] DOB/AGE: 07/24/1945 72 y.o.  Admit date: 08/07/2017 Discharge date: 08/10/2017   Discharge Diagnoses:  Active Problems:   Acute calculous cholecystitis   Procedures: Laparoscopic cholecystectomy  Hospital Course: This a patient with acute cholecystitis who was taken the operating room and laparoscopic cholecystectomy was performed.  This confirmed the diagnosis of acute cholecystitis but also demonstrated hydropic bile.  Postoperatively he is tolerating a regular diet and his JP drain has been removed without sign of bile leak etc.  He is given instructions concerning removing his dressing tomorrow and showering tomorrow.  Steri-Strips are currently in place.  He will follow-up in my office next week.  Consults: None  Disposition:    Allergies as of 08/10/2017   No Known Allergies     Medication List    TAKE these medications   amoxicillin-clavulanate 875-125 MG tablet Commonly known as:  AUGMENTIN Take 1 tablet by mouth 2 (two) times daily.   HYDROcodone-acetaminophen 5-325 MG tablet Commonly known as:  NORCO/VICODIN Take 1 tablet by mouth every 6 (six) hours as needed for moderate pain.   omeprazole 20 MG capsule Commonly known as:  PRILOSEC Take 20 mg by mouth daily.      Follow-up Information    Florene Glen, MD Follow up in 8 day(s).   Specialty:  General Surgery Contact information: [address] [place] [id number] [phone]           Florene Glen, MD, FACS

## 2017-08-10 NOTE — Progress Notes (Signed)
Discharge teaching given to patient, patient verbalized understanding and had no questions. Patient IV removed. Patient will be transported home by family. All patient belongings gathered prior to leaving.  

## 2017-08-10 NOTE — Discharge Instructions (Signed)
Cholecystitis Cholecystitis is inflammation of the gallbladder. It is often called a gallbladder attack. The gallbladder is a pear-shaped organ that lies beneath the liver on the right side of the body. The gallbladder stores bile, which is a fluid that helps the body to digest fats. If bile builds up in your gallbladder, your gallbladder becomes inflamed. This condition may occur suddenly (be acute). Repeat episodes of acute cholecystitis or prolonged episodes may lead to a long-term (chronic) condition. Cholecystitis is serious and it requires treatment. What are the causes? The most common cause of this condition is gallstones. Gallstones can block the tube (duct) that carries bile out of your gallbladder. This causes bile to build up. Other causes of this condition include:  Damage to the gallbladder due to a decrease in blood flow.  Infections in the bile ducts.  Scars or kinks in the bile ducts.  Tumors in the liver, pancreas, or gallbladder.  What increases the risk? This condition is more likely to develop in:  People who have sickle cell disease.  People who take birth control pills or use estrogen.  People who have alcoholic liver disease.  People who have liver cirrhosis.  People who have their nutrition delivered through a vein (parenteral nutrition).  People who do not eat or drink (do fasting) for a long period of time.  People who are obese.  People who have rapid weight loss.  People who are pregnant.  People who have increased triglyceride levels.  People who have pancreatitis.  What are the signs or symptoms? Symptoms of this condition include:  Abdominal pain, especially in the upper right area of the abdomen.  Abdominal tenderness or bloating.  Nausea.  Vomiting.  Fever.  Chills.  Yellowing of the skin and the whites of the eyes (jaundice).  How is this diagnosed? This condition is diagnosed with a medical history and physical exam. You  may also have other tests, including:  Imaging tests, such as: ? An ultrasound of the gallbladder. ? A CT scan of the abdomen. ? A gallbladder nuclear scan (HIDA scan). This scan allows your health care provider to see the bile moving from your liver to your gallbladder and to your small intestine. ? MRI.  Blood tests, such as: ? A complete blood count, because the white blood cell count may be higher than normal. ? Liver function tests, because some levels may be higher than normal with certain types of gallstones.  How is this treated? Treatment may include:  Fasting for a certain amount of time.  IV fluids.  Medicine to treat pain or vomiting.  Antibiotic medicine.  Surgery to remove your gallbladder (cholecystectomy). This may happen immediately or at a later time.  Follow these instructions at home: Home care will depend on your treatment. In general:  Take over-the-counter and prescription medicines only as told by your health care provider.  If you were prescribed an antibiotic medicine, take it as told by your health care provider. Do not stop taking the antibiotic even if you start to feel better.  Follow instructions from your health care provider about what to eat or drink. When you are allowed to eat, avoid eating or drinking anything that triggers your symptoms.  Keep all follow-up visits as told by your health care provider. This is important.  Contact a health care provider if:  Your pain is not controlled with medicine.  You have a fever. Get help right away if:  Your pain moves to another  part of your abdomen or to your back.  You continue to have symptoms or you develop new symptoms even with treatment. This information is not intended to replace advice given to you by your health care provider. Make sure you discuss any questions you have with your health care provider. Document Released: 02/22/2005 Document Revised: 07/03/2015 Document Reviewed:  06/05/2014 Elsevier Interactive Patient Education  2018 [place] dressing in 24 hours. May shower in 24 hours. Leave paper strips in place. Resume all home medications. Follow-up with Dr. Burt Knack in 10 days.

## 2017-08-10 NOTE — Progress Notes (Signed)
2 Days Post-Op  Subjective: Patient feels well and is tolerating a diet.  No problems.  Objective: Vital signs in last 24 hours: Temp:  [97.4 F (36.3 C)-98.1 F (36.7 C)] 97.6 F (36.4 C) (06/05 0448) Pulse Rate:  [64-76] 64 (06/05 0448) Resp:  [18-20] 18 (06/05 0448) BP: (126-142)/(73-87) 126/73 (06/05 0448) SpO2:  [98 %-100 %] 99 % (06/05 0448) Last BM Date: 08/09/17  Intake/Output from previous day: 06/04 0701 - 06/05 0700 In: 2985.4 [P.O.:1480; I.V.:1355.4; IV Piggyback:150] Out: 2616 [Urine:2575; Drains:40; Stool:1] Intake/Output this shift: No intake/output data recorded.  Physical exam:  Wounds are clean no erythema no drainage no bile in JP drain nontender calves no icterus no jaundice  Lab Results: CBC  Recent Labs    08/08/17 0458 08/09/17 0436  WBC 9.9 12.4*  HGB 14.2 13.3  HCT 40.8 38.5*  PLT 140* 155   BMET Recent Labs    08/08/17 0458 08/09/17 0436  NA 137 140  K 3.4* 4.4  CL 104 107  CO2 25 25  GLUCOSE 134* 124*  BUN 11 11  CREATININE 0.83 0.89  CALCIUM 8.2* 8.4*   PT/INR No results for input(s): LABPROT, INR in the last 72 hours. ABG No results for input(s): PHART, HCO3 in the last 72 hours.  Invalid input(s): PCO2, PO2  Studies/Results: No results found.  Anti-infectives: Anti-infectives (From admission, onward)   Start     Dose/Rate Route Frequency Ordered Stop   08/10/17 0000  amoxicillin-clavulanate (AUGMENTIN) 875-125 MG tablet     1 tablet Oral 2 times daily 08/10/17 0700     08/08/17 1600  Ampicillin-Sulbactam (UNASYN) 3 g in sodium chloride 0.9 % 100 mL IVPB     3 g 200 mL/hr over 30 Minutes Intravenous Every 6 hours 08/08/17 1555     08/08/17 0015  cefTRIAXone (ROCEPHIN) 2 g in sodium chloride 0.9 % 100 mL IVPB  Status:  Discontinued     2 g 200 mL/hr over 30 Minutes Intravenous Every 24 hours 08/08/17 0009 08/08/17 1555      Assessment/Plan: s/p Procedure(s): LAPAROSCOPIC CHOLECYSTECTOMY   Patient doing very  well at this point JP drain is removed and dressing is placed.  He is he is to be discharged later today.  Florene Glen, MD, FACS  08/10/2017

## 2017-08-11 LAB — SURGICAL PATHOLOGY

## 2017-08-17 ENCOUNTER — Encounter: Payer: Self-pay | Admitting: Surgery

## 2017-08-17 ENCOUNTER — Telehealth: Payer: Self-pay | Admitting: Surgery

## 2017-08-17 ENCOUNTER — Ambulatory Visit (INDEPENDENT_AMBULATORY_CARE_PROVIDER_SITE_OTHER): Payer: Medicare Other | Admitting: Surgery

## 2017-08-17 VITALS — BP 190/85 | HR 99 | Ht 70.0 in | Wt 227.0 lb

## 2017-08-17 DIAGNOSIS — K8000 Calculus of gallbladder with acute cholecystitis without obstruction: Secondary | ICD-10-CM

## 2017-08-17 NOTE — Patient Instructions (Signed)
Return as needed. No heavy lifting for two weeks. he patient is aware to call back for any questions or concerns.

## 2017-08-17 NOTE — Telephone Encounter (Signed)
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 Payment has already been paid. Patient also said he would need a doctor's note for his work on when he returns.

## 2017-08-17 NOTE — Progress Notes (Signed)
Outpatient postop visit  08/17/2017  Curtis Scott is an 72 y.o. male.    Procedure: Laparoscopic cholecystectomy for acute cholecystitis  CC: Feels well  HPI: This patient who underwent lap scopic cholecystectomy for acute cholecystitis which is confirmed on pathology that has been reviewed.  His drain was removed prior to discharge.  Patient has no complaints at this time is doing well.  Medications reviewed.    Physical Exam:  BP (!) 190/85   Pulse 99   Ht 5\' 10"  (1.778 m)   Wt 227 lb (103 kg)   BMI 32.57 kg/m     PE:  Minimal ecchymosis no erythema no drainage icterus no jaundice   Assessment/Plan:  Pathology is reviewed.  Patient doing very well at this time.  He can follow-up on an as-needed basis.  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  I do not believe that he is able to do that at this time but should be released without restrictions in another 2 weeks.  Florene Glen, MD, FACS

## 2017-08-18 ENCOUNTER — Telehealth: Payer: Self-pay

## 2017-08-18 NOTE — Telephone Encounter (Signed)
Disability paperwork completed and placed in scan folder and faxed to [phone]. Patient notified.

## 2018-10-09 ENCOUNTER — Emergency Department
Admission: EM | Admit: 2018-10-09 | Discharge: 2018-10-09 | Disposition: A | Discharge status: Home / Self Care | Payer: Commercial Managed Care - PPO | Attending: Emergency Medicine | Admitting: Emergency Medicine

## 2018-10-09 ENCOUNTER — Other Ambulatory Visit: Payer: Self-pay

## 2018-10-09 ENCOUNTER — Encounter: Payer: Self-pay | Admitting: Emergency Medicine

## 2018-10-09 DIAGNOSIS — Z87891 Personal history of nicotine dependence: Secondary | ICD-10-CM | POA: Insufficient documentation

## 2018-10-09 DIAGNOSIS — X58XXXA Exposure to other specified factors, initial encounter: Secondary | ICD-10-CM | POA: Diagnosis not present

## 2018-10-09 DIAGNOSIS — G8929 Other chronic pain: Secondary | ICD-10-CM | POA: Diagnosis not present

## 2018-10-09 DIAGNOSIS — M545 Low back pain, unspecified: Secondary | ICD-10-CM

## 2018-10-09 DIAGNOSIS — Y939 Activity, unspecified: Secondary | ICD-10-CM | POA: Insufficient documentation

## 2018-10-09 DIAGNOSIS — Y929 Unspecified place or not applicable: Secondary | ICD-10-CM | POA: Insufficient documentation

## 2018-10-09 DIAGNOSIS — Y999 Unspecified external cause status: Secondary | ICD-10-CM | POA: Diagnosis not present

## 2018-10-09 DIAGNOSIS — S39012A Strain of muscle, fascia and tendon of lower back, initial encounter: Secondary | ICD-10-CM

## 2018-10-09 MED ORDER — LIDOCAINE 5 % EX PTCH
1.0000 | MEDICATED_PATCH | CUTANEOUS | Status: DC
  Administered 2018-10-09: 1 via TRANSDERMAL

## 2018-10-09 MED ORDER — ACETAMINOPHEN 500 MG PO TABS
1000.0000 mg | ORAL_TABLET | Freq: Once | ORAL | Status: AC
  Administered 2018-10-09: 1000 mg via ORAL
  Filled 2018-10-09: qty 2

## 2018-10-09 MED ORDER — LIDOCAINE 5 % EX PTCH: 1.0000 | MEDICATED_PATCH | Freq: Two times a day (BID) | CUTANEOUS | 0 refills | Status: AC

## 2018-10-09 NOTE — ED Triage Notes (Signed)
Patient with complaint of lower back pain radiating down bilateral legs times three days.

## 2018-10-09 NOTE — ED Provider Notes (Signed)
[facility] Emergency Department Provider Note   ____________________________________________   First MD Initiated Contact with Patient 10/09/18 [phone]     (approximate)  I have reviewed the triage vital signs and the nursing notes.   HISTORY  Chief Complaint Back Pain    HPI Curtis Scott is a 73 y.o. male presenting with low back pain.  Patient reports he has had gradually worsening pain in his bilateral lower back for the past 3 days.  He describes it as a dull ache that extends across the entirety of his low back.  It seems to radiate into his bilateral thighs but not all the way to his feet.  He denies any numbness or weakness in his lower extremities, has not had any saddle anesthesia.  He also denies any urinary retention or incontinence.  He has not had any abdominal pain or upper back pain, denies any fevers.  He does state that he has had issues with his back in the past similar to this, has not taken any medicines for this at home.        Past Medical History:  Diagnosis Date  . GERD (gastroesophageal reflux disease)     Patient Active Problem List   Diagnosis Date Noted  . Acute calculous cholecystitis 08/08/2017  . Colon polyps 03/19/2016    Past Surgical History:  Procedure Laterality Date  . CHOLECYSTECTOMY N/A 08/08/2017   Procedure: LAPAROSCOPIC CHOLECYSTECTOMY;  Surgeon: Florene Glen, MD;  Location: [facility] ORS;  Service: General;  Laterality: N/A;  . COLONOSCOPY    . COLONOSCOPY WITH PROPOFOL N/A 07/09/2016   Procedure: COLONOSCOPY WITH PROPOFOL;  Surgeon: Lollie Sails, MD;  Location: [facility] ENDOSCOPY;  Service: Endoscopy;  Laterality: N/A;  . ESOPHAGOGASTRODUODENOSCOPY (EGD) WITH PROPOFOL N/A 07/09/2016   Procedure: ESOPHAGOGASTRODUODENOSCOPY (EGD) WITH PROPOFOL;  Surgeon: Lollie Sails, MD;  Location: [facility] ENDOSCOPY;  Service: Endoscopy;  Laterality: N/A;  . LEG SURGERY Right   . SHOULDER SURGERY Right     Prior to  Admission medications   Medication Sig Start Date End Date Taking? Authorizing Provider  lidocaine (LIDODERM) 5 % Place 1 patch onto the skin every 12 (twelve) hours. Remove & Discard patch within 12 hours or as directed by MD 10/09/18 10/09/19  Blake Divine, MD  omeprazole (PRILOSEC) 20 MG capsule Take 20 mg by mouth daily.    [provider]    Allergies Patient has no known allergies.  No family history on file.  Social History Social History   Tobacco Use  . Smoking status: Former Research scientist (life sciences)  . Smokeless tobacco: Never Used  Substance Use Topics  . Alcohol use: No  . Drug use: No    Review of Systems  Constitutional: No fever/chills Eyes: No visual changes. ENT: No sore throat. Cardiovascular: Denies chest pain. Respiratory: Denies shortness of breath. Gastrointestinal: No abdominal pain.  No nausea, no vomiting.  No diarrhea.  No constipation. Genitourinary: Negative for dysuria. Musculoskeletal:  Back pain. Skin: Negative for rash. Neurological: Negative for headaches, focal weakness or numbness.  ____________________________________________   PHYSICAL EXAM:  VITAL SIGNS: ED Triage Vitals  Enc Vitals Group     BP 10/09/18 0632 (!) 141/72     Pulse Rate 10/09/18 0632 76     Resp 10/09/18 0632 18     Temp 10/09/18 0632 98 F (36.7 C)     Temp Source 10/09/18 0632 Oral     SpO2 10/09/18 0632 95 %     Weight 10/09/18  0626 220 lb (99.8 kg)     Height 10/09/18 0626 5\' 10"  (1.778 m)     Head Circumference --      Peak Flow --      Pain Score 10/09/18 0626 8     Pain Loc --      Pain Edu? --      Excl. in [place]? --    Constitutional: Alert and oriented. Eyes: Conjunctivae are normal. Head: Atraumatic. Nose: No congestion/rhinnorhea. Mouth/Throat: Mucous membranes are moist. Neck: Normal ROM Cardiovascular: Normal rate, regular rhythm. Grossly normal heart sounds. Respiratory: Normal respiratory effort.  No retractions. Lungs CTAB. Gastrointestinal:  Soft and nontender. No distention. Genitourinary: deferred Musculoskeletal: No lower extremity tenderness nor edema.  Tenderness to low back diffusely, no focal bony tenderness. Neurologic:  Normal speech and language. No gross focal neurologic deficits are appreciated.  Strength 5 out of 5 in bilateral lower extremities, sensation intact throughout bilateral lower extremities. Skin:  Skin is warm, dry and intact. No rash noted. Psychiatric: Mood and affect are normal. Speech and behavior are normal.  ____________________________________________   LABS (all labs ordered are listed, but only abnormal results are displayed)  Labs Reviewed - No data to display   ____________________________________________   PROCEDURES  Procedure(s) performed (including Critical Care):  Procedures   ____________________________________________   INITIAL IMPRESSION / ASSESSMENT AND PLAN / ED COURSE       73 year old male presenting with acute on chronic bilateral lower back pain.  No red flag symptoms including saddle anesthesia, lower extremity weakness, fever, history of malignancy, urinary retention/incontinence.  No indication for imaging as there is no traumatic mechanism.  Will treat conservatively with Lidoderm patches and Tylenol, counseled patient to follow-up with PCP and otherwise return to the ED for new or worsening symptoms, patient agrees with plan.      ____________________________________________   FINAL CLINICAL IMPRESSION(S) / ED DIAGNOSES  Final diagnoses:  Strain of lumbar region, initial encounter  Chronic bilateral low back pain without sciatica     ED Discharge Orders         Ordered    lidocaine (LIDODERM) 5 %  Every 12 hours     10/09/18 0806           Note:  This document was prepared using Dragon voice recognition software and may include unintentional dictation errors.   Blake Divine, MD 10/09/18 1655

## 2019-08-22 IMAGING — US US ABDOMEN LIMITED
1 series · 14 of 25 positions shown · non-contrast
Comparison: CT abdomen and pelvis August 07, 2017

CLINICAL DATA: Upper abdominal pain and distension, nausea. History
of hernia repair.

EXAM:
ULTRASOUND ABDOMEN LIMITED RIGHT UPPER QUADRANT

[Series 1: us abdomen limited · 14 of 58 slices shown]
[im 1/58]
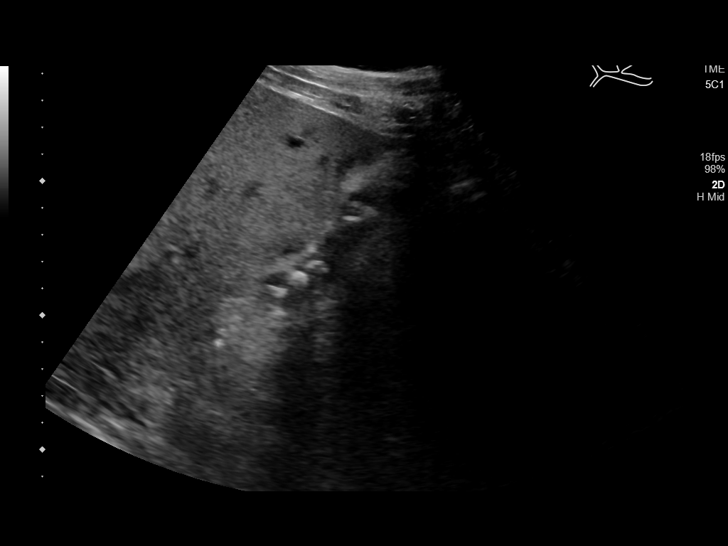
[im 5/58]
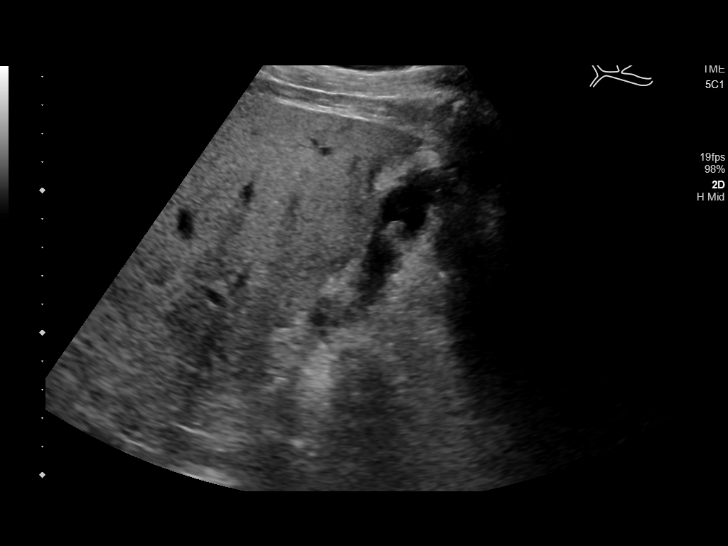
[im 10/58]
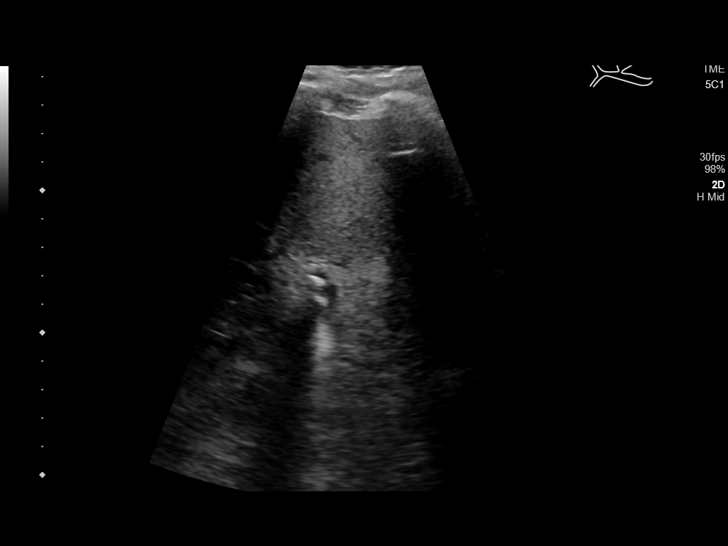
[im 15/58]
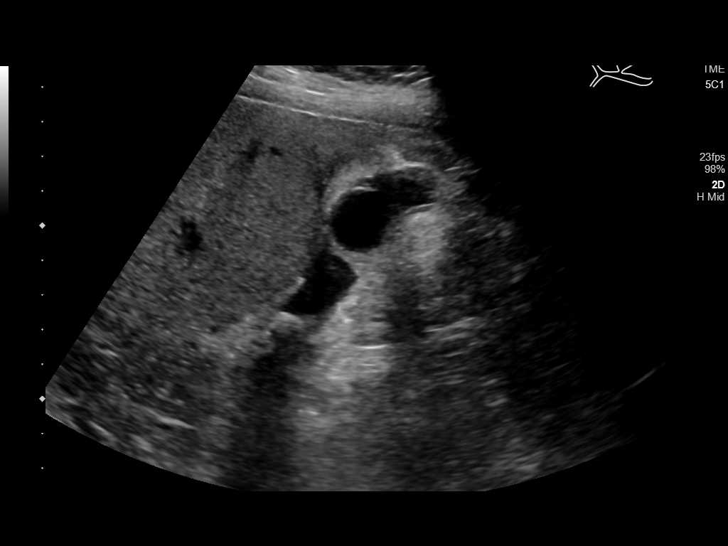
[im 20/58]
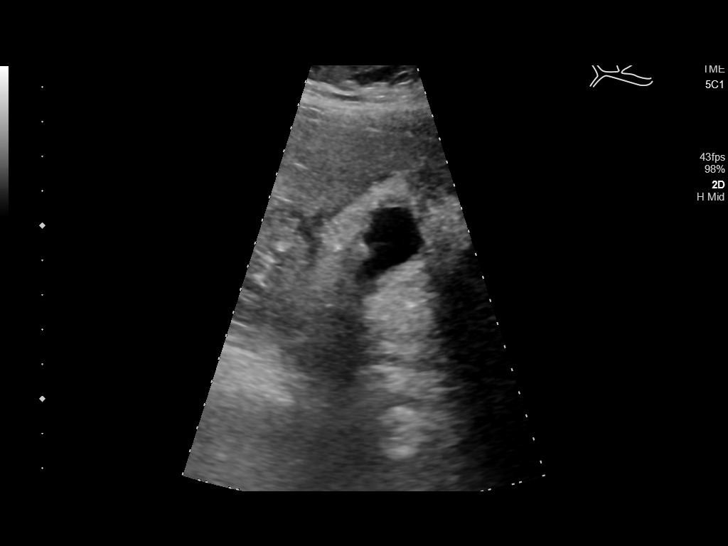
[im 22/58]
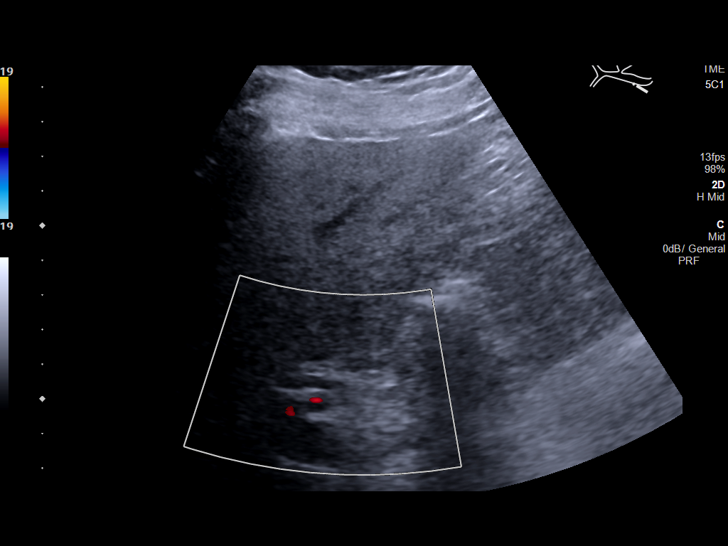
[im 27/58]
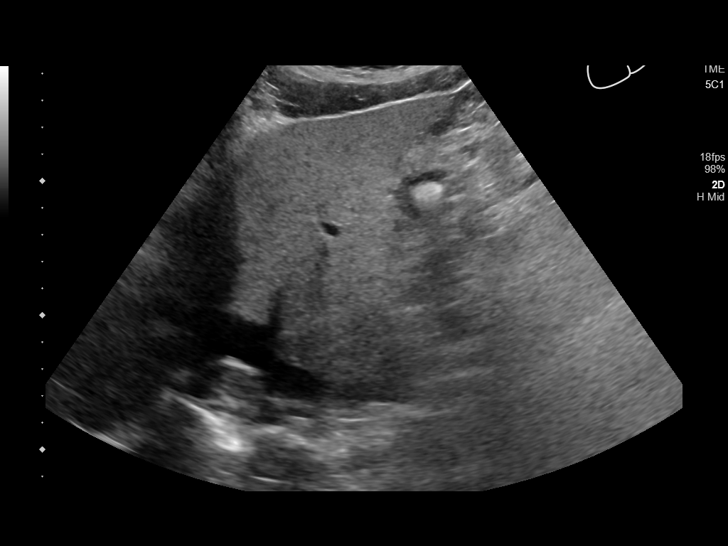
[im 31/58]
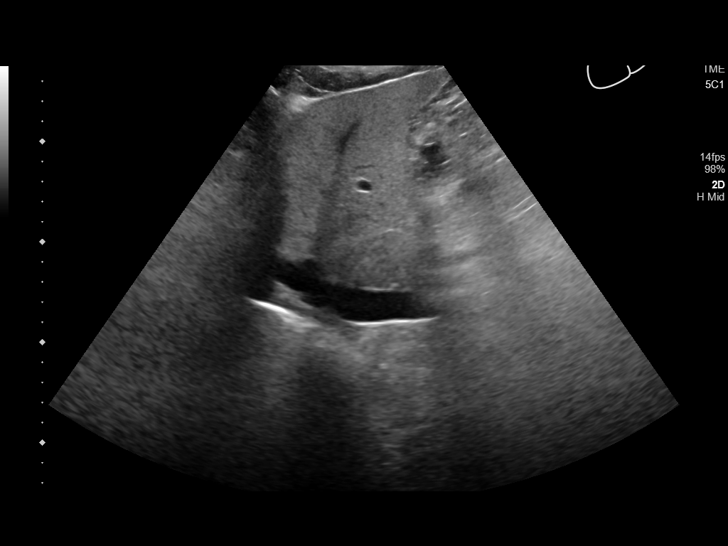
[im 36/58]
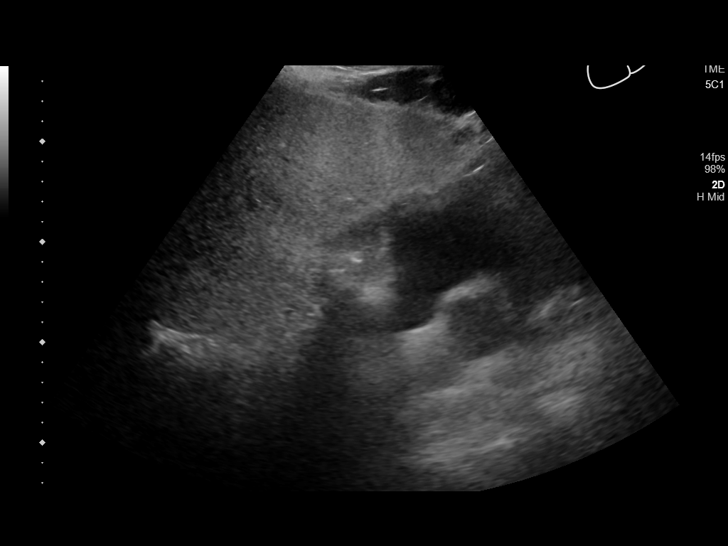
[im 39/58]
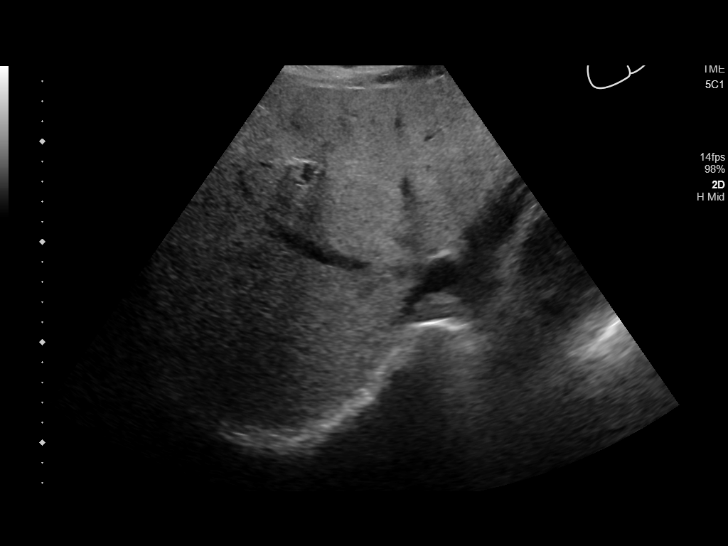
[im 43/58]
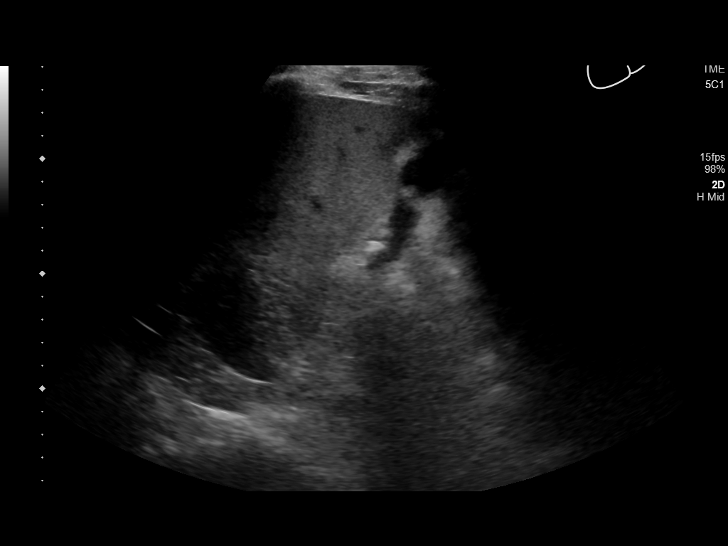
[im 48/58]
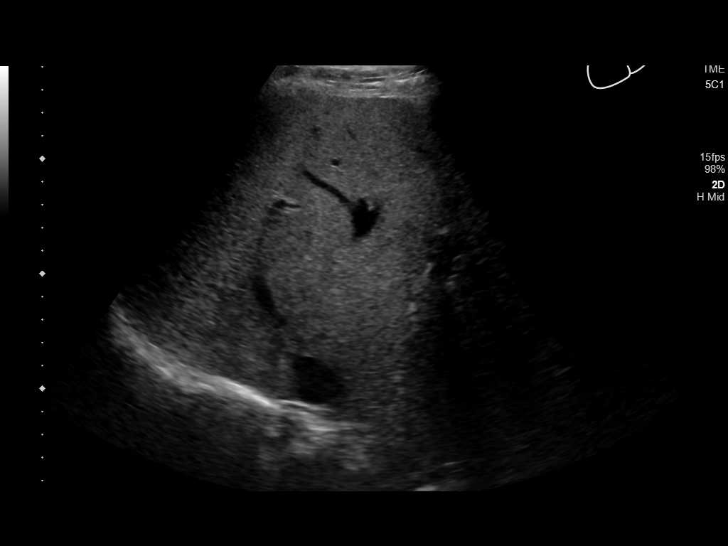
[im 53/58]
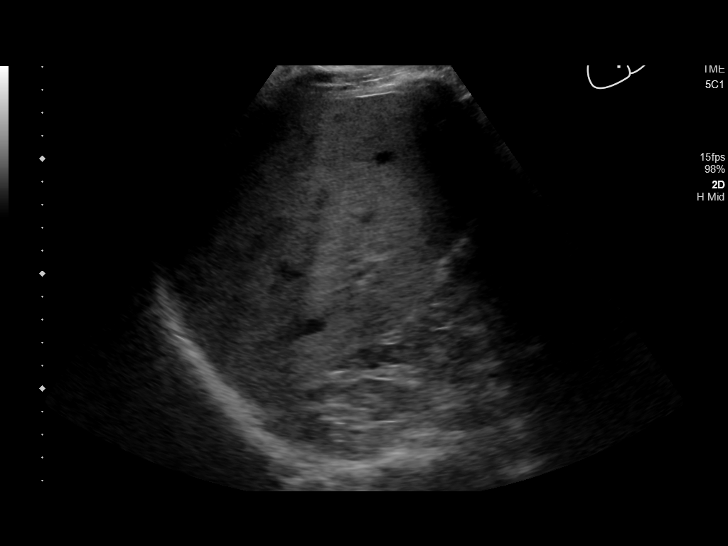
[im 58/58]
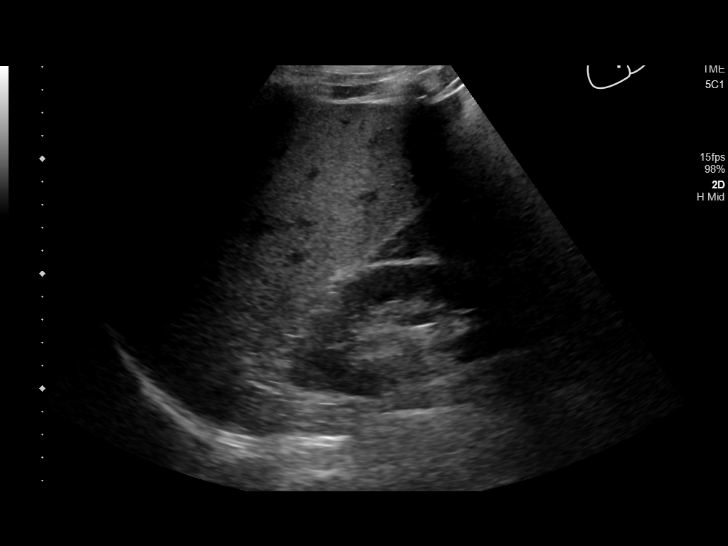

[14 of 25 positions shown; findings below may reference images not displayed]

FINDINGS: Gallbladder:

Multiple echogenic gallstones measuring to 5 mm, 1 of which is at
the gallbladder neck and appears nonmobile. Gallbladder wall
thickening at 8 mm. Pericholecystic fluid. No sonographic Murphy's
sign elicited.

Common bile duct:

Diameter: 4 mm

Liver:

No focal lesion identified. Diffusely increased parenchymal
echogenicity. Portal vein is patent on color Doppler imaging with
normal direction of blood flow towards the liver.
IMPRESSION: 1. Cholelithiasis and sonographic findings of acute cholecystitis,
however no sonographic Murphy's sign elicited.
2. Hepatic steatosis.

## 2020-02-14 ENCOUNTER — Other Ambulatory Visit: Payer: Self-pay

## 2020-02-14 ENCOUNTER — Ambulatory Visit: Payer: Medicare HMO | Admitting: Dermatology

## 2020-02-14 DIAGNOSIS — L820 Inflamed seborrheic keratosis: Secondary | ICD-10-CM

## 2020-02-14 DIAGNOSIS — D229 Melanocytic nevi, unspecified: Secondary | ICD-10-CM

## 2020-02-14 DIAGNOSIS — D1800 Hemangioma unspecified site: Secondary | ICD-10-CM

## 2020-02-14 DIAGNOSIS — L570 Actinic keratosis: Secondary | ICD-10-CM

## 2020-02-14 DIAGNOSIS — Z1283 Encounter for screening for malignant neoplasm of skin: Secondary | ICD-10-CM

## 2020-02-14 DIAGNOSIS — L578 Other skin changes due to chronic exposure to nonionizing radiation: Secondary | ICD-10-CM

## 2020-02-14 DIAGNOSIS — L821 Other seborrheic keratosis: Secondary | ICD-10-CM | POA: Diagnosis not present

## 2020-02-14 DIAGNOSIS — L814 Other melanin hyperpigmentation: Secondary | ICD-10-CM | POA: Diagnosis not present

## 2020-02-14 NOTE — Patient Instructions (Signed)

## 2020-02-14 NOTE — Progress Notes (Signed)
   Follow-Up Visit   Subjective  Curtis Scott is a 74 y.o. male who presents for the following: Annual Exam (Pt here for waist up mole check ). The patient presents for Upper Body Skin Exam (UBSE) for skin cancer screening and mole check. Pt c/o irritated itchy growth on the center of his chest, he would like this area removed today.  The following portions of the chart were reviewed this encounter and updated as appropriate:   Tobacco  Allergies  Meds  Problems  Med Hx  Surg Hx  Fam Hx     Review of Systems:  No other skin or systemic complaints except as noted in HPI or Assessment and Plan.  Objective  Well appearing patient in no apparent distress; mood and affect are within normal limits.  All skin waist up examined.  Objective  superior sternum: 1.5 cm Erythematous keratotic or waxy stuck-on papule or plaque.   Objective  R temple, L side burn (3): Erythematous thin papules/macules with gritty scale.    Assessment & Plan  Inflamed seborrheic keratosis superior sternum  Epidermal / dermal shaving - superior sternum  Lesion diameter (cm):  1.5 Informed consent: discussed and consent obtained   Timeout: patient name, date of birth, surgical site, and procedure verified   Procedure prep:  Patient was prepped and draped in usual sterile fashion Prep type:  Isopropyl alcohol Anesthesia: the lesion was anesthetized in a standard fashion   Anesthetic:  1% lidocaine w/ epinephrine 1-100,000 buffered w/ 8.4% NaHCO3 Hemostasis achieved with: pressure, aluminum chloride and electrodesiccation   Outcome: patient tolerated procedure well   Post-procedure details: sterile dressing applied and wound care instructions given   Dressing type: bandage and petrolatum    AK (actinic keratosis) (3) R temple, L side burn  Destruction of lesion - R temple, L side burn Complexity: simple   Destruction method: cryotherapy   Informed consent: discussed and consent obtained    Timeout:  patient name, date of birth, surgical site, and procedure verified Lesion destroyed using liquid nitrogen: Yes   Region frozen until ice ball extended beyond lesion: Yes   Outcome: patient tolerated procedure well with no complications   Post-procedure details: wound care instructions given    Lentigines - Scattered tan macules - Discussed due to sun exposure - Benign, observe - Call for any changes  Seborrheic Keratoses - Stuck-on, waxy, tan-brown papules and plaques  - Discussed benign etiology and prognosis. - Observe - Call for any changes  Melanocytic Nevi - Tan-brown and/or pink-flesh-colored symmetric macules and papules - Benign appearing on exam today - Observation - Call clinic for new or changing moles - Recommend daily use of broad spectrum spf 30+ sunscreen to sun-exposed areas.   Hemangiomas - Red papules - Discussed benign nature - Observe - Call for any changes  Actinic Damage - Chronic, secondary to cumulative UV/sun exposure - diffuse scaly erythematous macules with underlying dyspigmentation - Recommend daily broad spectrum sunscreen SPF 30+ to sun-exposed areas, reapply every 2 hours as needed.  - Call for new or changing lesions.  Skin cancer screening performed today.  Return in about 1 year (around 02/13/2021).  IMarye Round, CMA, am acting as scribe for Sarina Ser, MD .  Documentation: I have reviewed the above documentation for accuracy and completeness, and I agree with the above.  Sarina Ser, MD

## 2020-02-20 ENCOUNTER — Encounter: Payer: Self-pay | Admitting: Dermatology

## 2021-02-19 ENCOUNTER — Other Ambulatory Visit: Payer: Self-pay

## 2021-02-19 ENCOUNTER — Ambulatory Visit: Payer: Medicare HMO | Admitting: Dermatology

## 2021-02-19 DIAGNOSIS — L814 Other melanin hyperpigmentation: Secondary | ICD-10-CM

## 2021-02-19 DIAGNOSIS — D1800 Hemangioma unspecified site: Secondary | ICD-10-CM

## 2021-02-19 DIAGNOSIS — L820 Inflamed seborrheic keratosis: Secondary | ICD-10-CM

## 2021-02-19 DIAGNOSIS — L578 Other skin changes due to chronic exposure to nonionizing radiation: Secondary | ICD-10-CM

## 2021-02-19 DIAGNOSIS — L720 Epidermal cyst: Secondary | ICD-10-CM | POA: Diagnosis not present

## 2021-02-19 DIAGNOSIS — D229 Melanocytic nevi, unspecified: Secondary | ICD-10-CM | POA: Diagnosis not present

## 2021-02-19 DIAGNOSIS — Z1283 Encounter for screening for malignant neoplasm of skin: Secondary | ICD-10-CM | POA: Diagnosis not present

## 2021-02-19 DIAGNOSIS — L821 Other seborrheic keratosis: Secondary | ICD-10-CM

## 2021-02-19 NOTE — Progress Notes (Signed)
Follow-Up Visit   Subjective  Curtis Scott is a 75 y.o. male who presents for the following: Annual Exam (No history of skin cancer or abnormal moles - TBSE today) and Other (Few spots of arms that itch sometimes). The patient presents for Total-Body Skin Exam (TBSE) for skin cancer screening and mole check.  The patient has spots, moles and lesions to be evaluated, some may be new or changing and the patient has concerns that these could be cancer.  The following portions of the chart were reviewed this encounter and updated as appropriate:   Tobacco   Allergies   Meds   Problems   Med Hx   Surg Hx   Fam Hx      Review of Systems:  No other skin or systemic complaints except as noted in HPI or Assessment and Plan.  Objective  Well appearing patient in no apparent distress; mood and affect are within normal limits.  A full examination was performed including scalp, head, eyes, ears, nose, lips, neck, chest, axillae, abdomen, back, buttocks, bilateral upper extremities, bilateral lower extremities, hands, feet, fingers, toes, fingernails, and toenails. All findings within normal limits unless otherwise noted below.  Arms x 17, face/ears x 15 (32) Erythematous stuck-on, waxy papule or plaque  Neck - Posterior, Right Buccal Cheek 3.2 cm Subcutaneous nodule of left cheek. 2.0 cm Subcutaneous nodule of left post neck. 3.0 cm Subcutaneous nodule of left upper back.   Assessment & Plan   Lentigines - Scattered tan macules - Due to sun exposure - Benign-appearing, observe - Recommend daily broad spectrum sunscreen SPF 30+ to sun-exposed areas, reapply every 2 hours as needed. - Call for any changes  Seborrheic Keratoses - Stuck-on, waxy, tan-brown papules and/or plaques  - Benign-appearing - Discussed benign etiology and prognosis. - Observe - Call for any changes  Melanocytic Nevi - Tan-brown and/or pink-flesh-colored symmetric macules and papules - Benign appearing on  exam today - Observation - Call clinic for new or changing moles - Recommend daily use of broad spectrum spf 30+ sunscreen to sun-exposed areas.   Hemangiomas - Red papules - Discussed benign nature - Observe - Call for any changes  Actinic Damage - Chronic condition, secondary to cumulative UV/sun exposure - diffuse scaly erythematous macules with underlying dyspigmentation - Recommend daily broad spectrum sunscreen SPF 30+ to sun-exposed areas, reapply every 2 hours as needed.  - Staying in the shade or wearing long sleeves, sun glasses (UVA+UVB protection) and wide brim hats (4-inch brim around the entire circumference of the hat) are also recommended for sun protection.  - Call for new or changing lesions.  Skin cancer screening performed today.  Inflamed seborrheic keratosis (32) Arms x 17, face/ears x 15  Destruction of lesion - Arms x 17, face/ears x 15 Complexity: simple   Destruction method: cryotherapy   Informed consent: discussed and consent obtained   Timeout:  patient name, date of birth, surgical site, and procedure verified Lesion destroyed using liquid nitrogen: Yes   Region frozen until ice ball extended beyond lesion: Yes   Outcome: patient tolerated procedure well with no complications   Post-procedure details: wound care instructions given    Epidermal inclusion cyst (2) Neck - Posterior; Right Buccal Cheek  Recheck on follow up  Skin cancer screening   Return in about 6 months (around 08/20/2021) for sun exposed areas.  I, Ashok Cordia, CMA, am acting as scribe for Sarina Ser, MD . Documentation: I have reviewed the above documentation  for accuracy and completeness, and I agree with the above.  Sarina Ser, MD

## 2021-02-19 NOTE — Patient Instructions (Signed)

## 2021-02-24 ENCOUNTER — Encounter: Payer: Self-pay | Admitting: Dermatology

## 2021-08-27 ENCOUNTER — Ambulatory Visit (INDEPENDENT_AMBULATORY_CARE_PROVIDER_SITE_OTHER): Payer: Medicare PPO | Admitting: Dermatology

## 2021-08-27 DIAGNOSIS — L821 Other seborrheic keratosis: Secondary | ICD-10-CM

## 2021-08-27 DIAGNOSIS — D229 Melanocytic nevi, unspecified: Secondary | ICD-10-CM | POA: Diagnosis not present

## 2021-08-27 DIAGNOSIS — Z1283 Encounter for screening for malignant neoplasm of skin: Secondary | ICD-10-CM

## 2021-08-27 DIAGNOSIS — L720 Epidermal cyst: Secondary | ICD-10-CM

## 2021-08-27 DIAGNOSIS — L820 Inflamed seborrheic keratosis: Secondary | ICD-10-CM

## 2021-08-27 DIAGNOSIS — L578 Other skin changes due to chronic exposure to nonionizing radiation: Secondary | ICD-10-CM

## 2021-08-27 DIAGNOSIS — D1800 Hemangioma unspecified site: Secondary | ICD-10-CM

## 2021-08-27 DIAGNOSIS — L570 Actinic keratosis: Secondary | ICD-10-CM | POA: Diagnosis not present

## 2021-08-27 DIAGNOSIS — L814 Other melanin hyperpigmentation: Secondary | ICD-10-CM

## 2021-08-27 NOTE — Progress Notes (Unsigned)
Follow-Up Visit   Subjective  Curtis Scott is a 76 y.o. male who presents for the following: Follow-up. 6 months f/u  The patient presents for Upper Body Skin Exam (UBSE) for skin cancer screening and mole check.  The patient has spots, moles and lesions to be evaluated, some may be new or changing and the patient has concerns that these could be cancer.    The following portions of the chart were reviewed this encounter and updated as appropriate:       Review of Systems:  No other skin or systemic complaints except as noted in HPI or Assessment and Plan.  Objective  Well appearing patient in no apparent distress; mood and affect are within normal limits.  All skin waist up examined.  left post shoulder x 1 Stuck-on, waxy, tan-brown papule or plaque --Discussed benign etiology and prognosis.   Scalp x 1, right ear x 1  (2) (2) Erythematous thin papules/macules with gritty scale.   right cheek 3.0 cm Subcutaneous nodule.     Assessment & Plan  Inflamed seborrheic keratosis left post shoulder x 1  Symptomatic, irritating, patient would like treated.   Destruction of lesion - left post shoulder x 1  AK (actinic keratosis) (2) Scalp x 1, right ear x 1  (2)  Actinic keratoses are precancerous spots that appear secondary to cumulative UV radiation exposure/sun exposure over time. They are chronic with expected duration over 1 year. A portion of actinic keratoses will progress to squamous cell carcinoma of the skin. It is not possible to reliably predict which spots will progress to skin cancer and so treatment is recommended to prevent development of skin cancer.  Recommend daily broad spectrum sunscreen SPF 30+ to sun-exposed areas, reapply every 2 hours as needed.  Recommend staying in the shade or wearing long sleeves, sun glasses (UVA+UVB protection) and wide brim hats (4-inch brim around the entire circumference of the hat). Call for new or changing lesions.    Destruction of lesion - Scalp x 1, right ear x 1  (2) Complexity: simple   Destruction method: cryotherapy   Informed consent: discussed and consent obtained   Timeout:  patient name, date of birth, surgical site, and procedure verified Lesion destroyed using liquid nitrogen: Yes   Region frozen until ice ball extended beyond lesion: Yes   Outcome: patient tolerated procedure well with no complications   Post-procedure details: wound care instructions given    Epidermal inclusion cyst right cheek  Benign-appearing. Exam most consistent with an epidermal inclusion cyst. Discussed that a cyst is a benign growth that can grow over time and sometimes get irritated or inflamed. Recommend observation if it is not bothersome. Discussed option of surgical excision to remove it if it is growing, symptomatic, or other changes noted. Please call for new or changing lesions so they can be evaluated.    Lentigines - Scattered tan macules - Due to sun exposure - Benign-appearing, observe - Recommend daily broad spectrum sunscreen SPF 30+ to sun-exposed areas, reapply every 2 hours as needed. - Call for any changes  Seborrheic Keratoses - Stuck-on, waxy, tan-brown papules and/or plaques  - Benign-appearing - Discussed benign etiology and prognosis. - Observe - Call for any changes  Melanocytic Nevi - Tan-brown and/or pink-flesh-colored symmetric macules and papules - Benign appearing on exam today - Observation - Call clinic for new or changing moles - Recommend daily use of broad spectrum spf 30+ sunscreen to sun-exposed areas.   Hemangiomas -  Red papules - Discussed benign nature - Observe - Call for any changes  Actinic Damage - Chronic condition, secondary to cumulative UV/sun exposure - diffuse scaly erythematous macules with underlying dyspigmentation - Recommend daily broad spectrum sunscreen SPF 30+ to sun-exposed areas, reapply every 2 hours as needed.  - Staying in the  shade or wearing long sleeves, sun glasses (UVA+UVB protection) and wide brim hats (4-inch brim around the entire circumference of the hat) are also recommended for sun protection.  - Call for new or changing lesions.  Skin cancer screening performed today.   Return in about 1 year (around 08/28/2022) for Aks. ISK [name], CMA, am acting as scribe for Sarina Ser, MD .

## 2021-08-28 ENCOUNTER — Encounter: Payer: Self-pay | Admitting: Dermatology

## 2022-03-09 ENCOUNTER — Ambulatory Visit: Payer: Medicare PPO | Admitting: Urology

## 2022-03-09 ENCOUNTER — Other Ambulatory Visit
Admission: RE | Admit: 2022-03-09 | Discharge: 2022-03-09 | Disposition: A | Discharge status: Home / Self Care | Payer: Medicare PPO | Attending: Urology | Admitting: Urology

## 2022-03-09 ENCOUNTER — Encounter: Payer: Self-pay | Admitting: Urology

## 2022-03-09 VITALS — BP 131/69 | HR 64 | Ht 70.0 in | Wt 226.8 lb

## 2022-03-09 DIAGNOSIS — R972 Elevated prostate specific antigen [PSA]: Secondary | ICD-10-CM | POA: Insufficient documentation

## 2022-03-09 NOTE — Patient Instructions (Signed)
You had an elevated PSA(prostate-specific antigen) blood test.  This is the blood test that looks for prostate cancer.  We will repeat a more sensitive test today, and if this is normal no other evaluation is needed.  However, if the PSA is still significantly elevated, we would recommend a prostate biopsy to rule out prostate cancer.  We will call with the blood test results in the next few days.  Prostate Cancer Screening  Prostate cancer screening is testing that is done to check for the presence of prostate cancer in men. The prostate gland is a walnut-sized gland that is located below the bladder and in front of the rectum in males. The function of the prostate is to add fluid to semen during ejaculation. Prostate cancer is one of the most common types of cancer in men. Who should have prostate cancer screening? Screening recommendations vary based on age and other risk factors, as well as between the professional organizations who make the recommendations. In general, screening is recommended if: You are age 81 to 74 and have an average risk for prostate cancer. You should talk with your health care provider about your need for screening and how often screening should be done. Because most prostate cancers are slow growing and will not cause death, screening in this age group is generally reserved for men who have a 70- to 15-year life expectancy. You are younger than age 61, and you have these risk factors: Having a father, brother, or uncle who has been diagnosed with prostate cancer. The risk is higher if your family member's cancer occurred at an early age or if you have multiple family members with prostate cancer at an early age. Being a male who is [place] or is of [place] or sub-Saharan African descent. In general, screening is not recommended if: You are younger than age 23. You are between the ages of 82 and 30 and you have no risk factors. You are 24 years of age or older. At this  age, the risks that screening can cause are greater than the benefits that it may provide. If you are at high risk for prostate cancer, your health care provider may recommend that you have screenings more often or that you start screening at a younger age. How is screening for prostate cancer done? The recommended prostate cancer screening test is a blood test called the prostate-specific antigen (PSA) test. PSA is a protein that is made in the prostate. As you age, your prostate naturally produces more PSA. Abnormally high PSA levels may be caused by: Prostate cancer. An enlarged prostate that is not caused by cancer (benign prostatic hyperplasia, or BPH). This condition is very common in older men. A prostate gland infection (prostatitis) or urinary tract infection. Certain medicines such as male hormones (like testosterone) or other medicines that raise testosterone levels. A rectal exam may be done as part of prostate cancer screening to help provide information about the size of your prostate gland. When a rectal exam is performed, it should be done after the PSA level is drawn to avoid any effect on the results. Depending on the PSA results, you may need more tests, such as: A physical exam to check the size of your prostate gland, if not done as part of screening. Blood and imaging tests. A procedure to remove tissue samples from your prostate gland for testing (biopsy). This is the only way to know for certain if you have prostate cancer. What are the benefits  of prostate cancer screening? Screening can help to identify cancer at an early stage, before symptoms start and when the cancer can be treated more easily. There is a small chance that screening may lower your risk of dying from prostate cancer. The chance is small because prostate cancer is a slow-growing cancer, and most men with prostate cancer die from a different cause. What are the risks of prostate cancer screening? The main  risk of prostate cancer screening is diagnosing and treating prostate cancer that would never have caused any symptoms or problems. This is called overdiagnosisand overtreatment. PSA screening cannot tell you if your PSA is high due to cancer or a different cause. A prostate biopsy is the only procedure to diagnose prostate cancer. Even the results of a biopsy may not tell you if your cancer needs to be treated. Slow-growing prostate cancer may not need any treatment other than monitoring, so diagnosing and treating it may cause unnecessary stress or other side effects. Questions to ask your health care provider When should I start prostate cancer screening? What is my risk for prostate cancer? How often do I need screening? What type of screening tests do I need? How do I get my test results? What do my results mean? Do I need treatment? Where to find more information The American Cancer Society: www.cancer.org American Urological Association: www.auanet.org Contact a health care provider if: You have difficulty urinating. You have pain when you urinate or ejaculate. You have blood in your urine or semen. You have pain in your back or in the area of your prostate. Summary Prostate cancer is a common type of cancer in men. The prostate gland is located below the bladder and in front of the rectum. This gland adds fluid to semen during ejaculation. Prostate cancer screening may identify cancer at an early stage, when the cancer can be treated more easily and is less likely to have spread to other areas of the body. The prostate-specific antigen (PSA) test is the recommended screening test for prostate cancer, but it has associated risks. Discuss the risks and benefits of prostate cancer screening with your health care provider. If you are age 98 or older, the risks that screening can cause are greater than the benefits that it may provide. This information is not intended to replace advice given  to you by your health care provider. Make sure you discuss any questions you have with your health care provider. Document Revised: 08/18/2020 Document Reviewed: 08/18/2020 Elsevier Patient Education  [place].  Transrectal Ultrasound-Guided Prostate Biopsy A transrectal ultrasound-guided prostate biopsy is a procedure to remove samples of prostate tissue for testing. The prostate is a walnut-sized gland that is located below the bladder and in front of the rectum. During this procedure, a small device (probe) is lubricated and put inside the rectum. The probe sends out sound waves that make a picture of the prostate and surrounding tissues (transrectal ultrasound). The images are used to help guide the process of removing the samples. The samples are taken to a lab to be checked for prostate cancer. This procedure is usually done to evaluate the prostate gland of men who have raised (elevated) levels of prostate-specific antigen (PSA), which can be a sign of prostate cancer or prostate enlargement related to aging (benign prostatic hyperplasia, or BPH). Tell a health care provider about: Any allergies you have. All medicines you are taking, including vitamins, herbs, eye drops, creams, and over-the-counter medicines. Any problems you or family  members have had with anesthetic medicines. Any bleeding problems you have. Any surgeries you have had. Any medical conditions you have. Any prostate infections you have had. What are the risks? Generally, this is a safe procedure. However, problems may occur, including: Prostate infection. Bleeding from the rectum. Blood in the urine. Allergic reactions to medicines. Damage to surrounding structures such as blood vessels, organs, or muscles. Difficulty passing urine. Nerve damage. This is usually temporary. What happens before the procedure? Medicines Ask your health care provider about: Changing or stopping your regular medicines. This  is especially important if you are taking diabetes medicines or blood thinners. Taking medicines such as aspirin and ibuprofen. These medicines can thin your blood. Do not take these medicines unless your health care provider tells you to take them. Taking over-the-counter medicines, vitamins, herbs, and supplements. General instructions Follow instructions from your health care provider about eating and drinking. In most instances, you will not need to stop eating and drinking completely before the procedure. You will be given an enema. During an enema, a liquid is injected into your rectum to clear out waste. You may have a blood or urine sample taken. Ask your health care provider what steps will be taken to help prevent infection. These steps may include: Washing skin with a germ-killing soap. Taking antibiotic medicine. If you will be going home right after the procedure, plan to have a responsible adult: Take you home from the hospital or clinic. You will not be allowed to drive. Care for you for the time you are told. What happens during the procedure?  An IV will be inserted into one of your veins. You will be given one or both of the following: A medicine to help you relax (sedative). A medicine to numb the area (local anesthetic). You will be placed on your left side, and your knees will be bent toward your chest. A probe with lubricated gel will be placed into your rectum, and images will be taken of your prostate and surrounding structures. Numbing medicine will be injected into your prostate. A biopsy needle will be inserted through your rectum or perineum and guided to your prostate using the ultrasound images. Prostate tissue samples will be removed, and the needle and probe will then be removed. The biopsy samples will be sent to a lab to be tested. The procedure may vary among health care providers and hospitals. What happens after the procedure? Your blood pressure, heart  rate, breathing rate, and blood oxygen level will be monitored until you leave the hospital or clinic. You may have some discomfort in the rectal area. You will be given pain medicine as needed. If you were given a sedative during the procedure, it can affect you for several hours. Do not drive or operate machinery until your health care provider says that it is safe. It is up to you to get the results of your procedure. Ask your health care provider, or the department that is doing the procedure, when your results will be ready. Keep all follow-up visits. This is important. Summary A transrectal ultrasound-guided biopsy removes samples of tissue from your prostate using ultrasound-guided sound waves to help guide the process. This procedure is usually done to evaluate the prostate gland of men who have raised (elevated) levels of prostate-specific antigen (PSA), which can be a sign of prostate cancer or prostate enlargement related to aging. After your procedure, you may feel some discomfort in the rectal area. Plan to have a responsible  adult take you home from the hospital or clinic, and follow up with your health care provider for your results. This information is not intended to replace advice given to you by your health care provider. Make sure you discuss any questions you have with your health care provider. Document Revised: 08/18/2020 Document Reviewed: 08/18/2020 Elsevier Patient Education  [place].

## 2022-03-09 NOTE — Progress Notes (Signed)
   03/09/22 2:22 PM   Curtis Scott Dec 02, 1945 [id number]  CC: Elevated PSA  HPI: Healthy 77 year old male referred for an elevated PSA of 8 from November 2023.  No prior PSA values to review.  He denies any known family history of prostate cancer.  He denies any urinary symptoms.  Prostate measures 77 g on CT from June 2019.   PMH: Past Medical History:  Diagnosis Date   GERD (gastroesophageal reflux disease)     Surgical History: Past Surgical History:  Procedure Laterality Date   CHOLECYSTECTOMY N/A 08/08/2017   Procedure: LAPAROSCOPIC CHOLECYSTECTOMY;  Surgeon: Florene Glen, MD;  Location: [facility] ORS;  Service: General;  Laterality: N/A;   COLONOSCOPY     COLONOSCOPY WITH PROPOFOL N/A 07/09/2016   Procedure: COLONOSCOPY WITH PROPOFOL;  Surgeon: Lollie Sails, MD;  Location: [facility] ENDOSCOPY;  Service: Endoscopy;  Laterality: N/A;   ESOPHAGOGASTRODUODENOSCOPY (EGD) WITH PROPOFOL N/A 07/09/2016   Procedure: ESOPHAGOGASTRODUODENOSCOPY (EGD) WITH PROPOFOL;  Surgeon: Lollie Sails, MD;  Location: [facility] ENDOSCOPY;  Service: Endoscopy;  Laterality: N/A;   LEG SURGERY Right    SHOULDER SURGERY Right     Social History:  reports that he has quit smoking. He has never used smokeless tobacco. He reports that he does not drink alcohol and does not use drugs.  Physical Exam: BP 131/69 (BP Location: Left Arm, Patient Position: Sitting, Cuff Size: Large)   Pulse 64   Ht '5\' 10"'$  (1.778 m)   Wt 226 lb 12.8 oz (102.9 kg)   BMI 32.54 kg/m    Constitutional:  Alert and oriented, No acute distress. Cardiovascular: No clubbing, cyanosis, or edema. Respiratory: Normal respiratory effort, no increased work of breathing. GI: Abdomen is soft, nontender, nondistended, no abdominal masses DRE: Limited by body habitus, 80g, smooth, no nodules  Laboratory Data: Reviewed, see HPI  Pertinent Imaging: I have personally viewed and interpreted the CT from June 2019 showing a 77 g  prostate.  Assessment & Plan:   77 year old healthy male with elevated PSA of 8.  We reviewed the implications of an elevated PSA and the uncertainty surrounding it. In general, a man's PSA increases with age and is produced by both normal and cancerous prostate tissue. The differential diagnosis for elevated PSA includes BPH, prostate cancer, infection, recent intercourse/ejaculation, recent urethroscopic manipulation (foley placement/cystoscopy) or trauma, and prostatitis.  Management of an elevated PSA can include observation or prostate biopsy and we discussed this in detail. Our goal is to detect clinically significant prostate cancers, and manage with either active surveillance, surgery, or radiation for localized disease. Risks of prostate biopsy include bleeding, infection (including life threatening sepsis), pain, and lower urinary symptoms. Hematuria, hematospermia, and blood in the stool are all common after biopsy and can persist up to 4 weeks.   We also discussed the AUA guidelines do not recommend routine screening in men over age 30, and risks and benefits of screening discussed at length.  With his otherwise good health and isolated elevated PSA, I recommended a repeat PSA with reflex to free today.  His PSA density is reassuring at 0.1.  We discussed possible need for biopsy if PSA remains significantly elevated, versus trending back to normal.  PSA with reflex to free today, call with results.  If significantly elevated will schedule prostate biopsy  Nickolas Madrid, MD 03/09/2022  [facility] [address], [place], [place] [address] [phone]

## 2022-03-10 LAB — PSA, TOTAL AND FREE
PSA, Free Pct: 26.1 %
PSA, Free: 1.8 ng/mL
Prostate Specific Ag, Serum: 6.9 ng/mL — ABNORMAL HIGH (ref 0.0–4.0)

## 2022-03-11 ENCOUNTER — Telehealth: Payer: Self-pay

## 2022-03-11 NOTE — Telephone Encounter (Signed)
-----   Message from Billey Co, MD sent at 03/11/2022 12:16 PM EST ----- Good news, PSA decreased and most likely just secondary to enlarged prostate, not prostate cancer.  Can follow-up with urology as needed, does not need further PSA screening.  Nickolas Madrid, MD 03/11/2022

## 2022-03-11 NOTE — Telephone Encounter (Signed)
Called pt informed him of the information below. Pt voiced understanding.  

## 2022-07-29 ENCOUNTER — Encounter: Payer: Self-pay | Admitting: *Deleted

## 2022-07-30 ENCOUNTER — Encounter: Admission: RE | Payer: Self-pay | Source: Home / Self Care

## 2022-07-30 ENCOUNTER — Ambulatory Visit: Admission: RE | Admit: 2022-07-30 | Payer: Medicare PPO | Source: Home / Self Care

## 2022-07-30 SURGERY — COLONOSCOPY WITH PROPOFOL
Anesthesia: General

## 2022-09-02 ENCOUNTER — Ambulatory Visit: Payer: Medicare PPO | Admitting: Dermatology

## 2022-09-23 ENCOUNTER — Encounter: Payer: Self-pay | Admitting: *Deleted

## 2022-09-24 ENCOUNTER — Ambulatory Visit: Admission: RE | Admit: 2022-09-24 | Payer: Medicare PPO | Source: Home / Self Care

## 2022-09-24 ENCOUNTER — Encounter: Admission: RE | Payer: Self-pay | Source: Home / Self Care

## 2022-09-24 SURGERY — COLONOSCOPY WITH PROPOFOL
Anesthesia: General

## 2022-09-26 ENCOUNTER — Encounter: Payer: Self-pay | Admitting: Certified Registered"

## 2022-10-27 ENCOUNTER — Ambulatory Visit (INDEPENDENT_AMBULATORY_CARE_PROVIDER_SITE_OTHER): Payer: Medicare PPO | Admitting: Dermatology

## 2022-10-27 DIAGNOSIS — L570 Actinic keratosis: Secondary | ICD-10-CM

## 2022-10-27 DIAGNOSIS — L729 Follicular cyst of the skin and subcutaneous tissue, unspecified: Secondary | ICD-10-CM | POA: Diagnosis not present

## 2022-10-27 DIAGNOSIS — L814 Other melanin hyperpigmentation: Secondary | ICD-10-CM | POA: Diagnosis not present

## 2022-10-27 DIAGNOSIS — Z1283 Encounter for screening for malignant neoplasm of skin: Secondary | ICD-10-CM

## 2022-10-27 DIAGNOSIS — L578 Other skin changes due to chronic exposure to nonionizing radiation: Secondary | ICD-10-CM

## 2022-10-27 DIAGNOSIS — L820 Inflamed seborrheic keratosis: Secondary | ICD-10-CM | POA: Diagnosis not present

## 2022-10-27 NOTE — Progress Notes (Signed)
Follow-Up Visit   Subjective  Curtis Scott is a 76 y.o. male who presents for the following: Yearly Skin Cancer Screening and Upper Body Skin Exam, hx of precancers.   The patient presents for Upper Body Skin Exam (UBSE) for skin cancer screening and mole check. The patient has spots, moles and lesions to be evaluated, some may be new or changing and the patient may have concern these could be cancer.    The following portions of the chart were reviewed this encounter and updated as appropriate: medications, allergies, medical history  Review of Systems:  No other skin or systemic complaints except as noted in HPI or Assessment and Plan.  Objective  Well appearing patient in no apparent distress; mood and affect are within normal limits.  All skin waist up examined. Relevant physical exam findings are noted in the Assessment and Plan.  Right Ear Erythematous thin papules/macules with gritty scale. Erythematous thin papules/macules with gritty scale.   left proximal mandible Stuck-on, waxy, tan-brown papule--Discussed benign etiology and prognosis.     Assessment & Plan   AK (actinic keratosis) Right Ear  Actinic keratoses are precancerous spots that appear secondary to cumulative UV radiation exposure/sun exposure over time. They are chronic with expected duration over 1 year. A portion of actinic keratoses will progress to squamous cell carcinoma of the skin. It is not possible to reliably predict which spots will progress to skin cancer and so treatment is recommended to prevent development of skin cancer.  Recommend daily broad spectrum sunscreen SPF 30+ to sun-exposed areas, reapply every 2 hours as needed.  Recommend staying in the shade or wearing long sleeves, sun glasses (UVA+UVB protection) and wide brim hats (4-inch brim around the entire circumference of the hat). Call for new or changing lesions.   Destruction of lesion - Right Ear Complexity: simple    Destruction method: cryotherapy   Informed consent: discussed and consent obtained   Timeout:  patient name, date of birth, surgical site, and procedure verified Lesion destroyed using liquid nitrogen: Yes   Region frozen until ice ball extended beyond lesion: Yes   Outcome: patient tolerated procedure well with no complications   Post-procedure details: wound care instructions given    Inflamed seborrheic keratosis left proximal mandible  Symptomatic, irritating, patient would like treated.   Destruction of lesion - left proximal mandible Complexity: simple   Destruction method: cryotherapy   Informed consent: discussed and consent obtained   Timeout:  patient name, date of birth, surgical site, and procedure verified Lesion destroyed using liquid nitrogen: Yes   Region frozen until ice ball extended beyond lesion: Yes   Outcome: patient tolerated procedure well with no complications   Post-procedure details: wound care instructions given     Skin cancer screening performed today.  Actinic Damage - Chronic condition, secondary to cumulative UV/sun exposure - diffuse scaly erythematous macules with underlying dyspigmentation - Recommend daily broad spectrum sunscreen SPF 30+ to sun-exposed areas, reapply every 2 hours as needed.  - Staying in the shade or wearing long sleeves, sun glasses (UVA+UVB protection) and wide brim hats (4-inch brim around the entire circumference of the hat) are also recommended for sun protection.  - Call for new or changing lesions.  Lentigines, Seborrheic Keratoses, Hemangiomas - Benign normal skin lesions - Benign-appearing - Call for any changes  Melanocytic Nevi - Tan-brown and/or pink-flesh-colored symmetric macules and papules - Benign appearing on exam today - Observation - Call clinic for new or changing moles -  Recommend daily use of broad spectrum spf 30+ sunscreen to sun-exposed areas.   EPIDERMAL INCLUSION CYST Exam: 3.0 cm  subcutaneous nodule at left upper back, 2.0 cm subcutaneous nodule left deltoid, 5.0 cm subcutaneous nodule left deltoid, 4.0 cm subcutaneous nodule at the right ear    Benign-appearing. Exam most consistent with an epidermal inclusion cyst. Discussed that a cyst is a benign growth that can grow over time and sometimes get irritated or inflamed. Recommend observation if it is not bothersome. Discussed option of surgical excision to remove it if it is growing, symptomatic, or other changes noted. Please call for new or changing lesions so they can be evaluated.     Return in about 1 year (around 10/27/2023) for UBSE, hx of AKs .  IAngelique Holm, CMA, am acting as scribe for Armida Sans, MD .   Documentation: I have reviewed the above documentation for accuracy and completeness, and I agree with the above.  Armida Sans, MD

## 2022-10-27 NOTE — Patient Instructions (Addendum)

## 2022-11-04 ENCOUNTER — Encounter: Payer: Self-pay | Admitting: Dermatology

## 2022-11-26 ENCOUNTER — Encounter: Payer: Self-pay | Admitting: *Deleted

## 2022-12-03 ENCOUNTER — Other Ambulatory Visit: Payer: Self-pay

## 2022-12-03 ENCOUNTER — Ambulatory Visit: Payer: Medicare PPO | Admitting: Certified Registered"

## 2022-12-03 ENCOUNTER — Ambulatory Visit
Admission: RE | Admit: 2022-12-03 | Discharge: 2022-12-03 | Disposition: A | Discharge status: Home / Self Care | Payer: Medicare PPO | Attending: Gastroenterology | Admitting: Gastroenterology

## 2022-12-03 ENCOUNTER — Encounter: Payer: Self-pay | Admitting: *Deleted

## 2022-12-03 ENCOUNTER — Encounter
Admission: RE | Disposition: A | Discharge status: Home / Self Care | Payer: Self-pay | Source: Home / Self Care | Attending: Gastroenterology

## 2022-12-03 DIAGNOSIS — K5730 Diverticulosis of large intestine without perforation or abscess without bleeding: Secondary | ICD-10-CM | POA: Insufficient documentation

## 2022-12-03 DIAGNOSIS — Z800 Family history of malignant neoplasm of digestive organs: Secondary | ICD-10-CM | POA: Diagnosis not present

## 2022-12-03 DIAGNOSIS — I10 Essential (primary) hypertension: Secondary | ICD-10-CM | POA: Insufficient documentation

## 2022-12-03 DIAGNOSIS — Z1211 Encounter for screening for malignant neoplasm of colon: Secondary | ICD-10-CM | POA: Insufficient documentation

## 2022-12-03 DIAGNOSIS — K640 First degree hemorrhoids: Secondary | ICD-10-CM | POA: Insufficient documentation

## 2022-12-03 DIAGNOSIS — K219 Gastro-esophageal reflux disease without esophagitis: Secondary | ICD-10-CM | POA: Insufficient documentation

## 2022-12-03 DIAGNOSIS — Z87891 Personal history of nicotine dependence: Secondary | ICD-10-CM | POA: Diagnosis not present

## 2022-12-03 DIAGNOSIS — E785 Hyperlipidemia, unspecified: Secondary | ICD-10-CM | POA: Insufficient documentation

## 2022-12-03 DIAGNOSIS — Z86010 Personal history of colonic polyps: Secondary | ICD-10-CM | POA: Diagnosis not present

## 2022-12-03 HISTORY — PX: COLONOSCOPY WITH PROPOFOL: SHX5780

## 2022-12-03 SURGERY — COLONOSCOPY WITH PROPOFOL
Anesthesia: General

## 2022-12-03 MED ORDER — SODIUM CHLORIDE 0.9 % IV SOLN: INTRAVENOUS | Status: DC

## 2022-12-03 MED ORDER — PROPOFOL 500 MG/50ML IV EMUL
INTRAVENOUS | Status: DC | PRN
  Administered 2022-12-03: 140 ug/kg/min via INTRAVENOUS

## 2022-12-03 MED ORDER — LIDOCAINE HCL (CARDIAC) PF 100 MG/5ML IV SOSY
PREFILLED_SYRINGE | INTRAVENOUS | Status: DC | PRN
  Administered 2022-12-03: 100 mg via INTRAVENOUS

## 2022-12-03 MED ORDER — PROPOFOL 10 MG/ML IV BOLUS
INTRAVENOUS | Status: DC | PRN
  Administered 2022-12-03: 60 mg via INTRAVENOUS

## 2022-12-03 NOTE — Anesthesia Preprocedure Evaluation (Signed)
Anesthesia Evaluation  Patient identified by MRN, date of birth, ID band Patient awake    Reviewed: Allergy & Precautions, NPO status , Patient's Chart, lab work & pertinent test results  Airway Mallampati: III  TM Distance: >3 FB Neck ROM: full    Dental  (+) Upper Dentures, Chipped   Pulmonary neg pulmonary ROS, former smoker   Pulmonary exam normal        Cardiovascular hypertension, On Medications negative cardio ROS Normal cardiovascular exam     Neuro/Psych negative neurological ROS  negative psych ROS   GI/Hepatic negative GI ROS, Neg liver ROS,GERD  Medicated,,  Endo/Other  negative endocrine ROS    Renal/GU negative Renal ROS  negative genitourinary   Musculoskeletal   Abdominal   Peds  Hematology negative hematology ROS (+)   Anesthesia Other Findings Past Medical History: No date: GERD (gastroesophageal reflux disease)  Past Surgical History: 08/08/2017: CHOLECYSTECTOMY; N/A     Comment:  Procedure: LAPAROSCOPIC CHOLECYSTECTOMY;  Surgeon:               Lattie Haw, MD;  Location: [facility] ORS;  Service:               General;  Laterality: N/A; No date: COLONOSCOPY 07/09/2016: COLONOSCOPY WITH PROPOFOL; N/A     Comment:  Procedure: COLONOSCOPY WITH PROPOFOL;  Surgeon:               Christena Deem, MD;  Location: [facility] ENDOSCOPY;                Service: Endoscopy;  Laterality: N/A; 07/09/2016: ESOPHAGOGASTRODUODENOSCOPY (EGD) WITH PROPOFOL; N/A     Comment:  Procedure: ESOPHAGOGASTRODUODENOSCOPY (EGD) WITH               PROPOFOL;  Surgeon: Christena Deem, MD;  Location:               [facility] ENDOSCOPY;  Service: Endoscopy;  Laterality: N/A; No date: LEG SURGERY; Right No date: SHOULDER SURGERY; Right     Reproductive/Obstetrics negative OB ROS                             Anesthesia Physical Anesthesia Plan  ASA: 2  Anesthesia Plan: General   Post-op Pain  Management: Minimal or no pain anticipated   Induction: Intravenous  PONV Risk Score and Plan: 3 and Propofol infusion, TIVA and Ondansetron  Airway Management Planned: Nasal Cannula  Additional Equipment: None  Intra-op Plan:   Post-operative Plan:   Informed Consent: I have reviewed the patients History and Physical, chart, labs and discussed the procedure including the risks, benefits and alternatives for the proposed anesthesia with the patient or authorized representative who has indicated his/her understanding and acceptance.     Dental advisory given  Plan Discussed with: CRNA and Surgeon  Anesthesia Plan Comments: (Discussed risks of anesthesia with patient, including possibility of difficulty with spontaneous ventilation under anesthesia necessitating airway intervention, PONV, and rare risks such as cardiac or respiratory or neurological events, and allergic reactions. Discussed the role of CRNA in patient's perioperative care. Patient understands.)       Anesthesia Quick Evaluation

## 2022-12-03 NOTE — Interval H&P Note (Signed)
History and Physical Interval Note:  12/03/2022 12:25 PM  Curtis Scott  has presented today for surgery, with the diagnosis of h/o colon polyps.  The various methods of treatment have been discussed with the patient and family. After consideration of risks, benefits and other options for treatment, the patient has consented to  Procedure(s): COLONOSCOPY WITH PROPOFOL (N/A) as a surgical intervention.  The patient's history has been reviewed, patient examined, no change in status, stable for surgery.  I have reviewed the patient's chart and labs.  Questions were answered to the patient's satisfaction.     Regis Bill  Ok to proceed with colonoscopy

## 2022-12-03 NOTE — Anesthesia Postprocedure Evaluation (Signed)
Anesthesia Post Note  Patient: Curtis Scott  Procedure(s) Performed: COLONOSCOPY WITH PROPOFOL  Patient location during evaluation: PACU Anesthesia Type: General Level of consciousness: awake and alert Pain management: pain level controlled Vital Signs Assessment: post-procedure vital signs reviewed and stable Respiratory status: spontaneous breathing, nonlabored ventilation, respiratory function stable and patient connected to nasal cannula oxygen Cardiovascular status: blood pressure returned to baseline and stable Postop Assessment: no apparent nausea or vomiting Anesthetic complications: no  No notable events documented.   Last Vitals:  Vitals:   12/03/22 1251 12/03/22 1303  BP: (!) 105/58 108/66  Pulse: 74 74  Resp: 20 (!) 22  Temp: (!) 35.7 C   SpO2: 100% 98%    Last Pain:  Vitals:   12/03/22 1303  TempSrc:   PainSc: 0-No pain                 Stephanie Coup

## 2022-12-03 NOTE — Transfer of Care (Signed)
Immediate Anesthesia Transfer of Care Note  Patient: TAUREAN JU  Procedure(s) Performed: COLONOSCOPY WITH PROPOFOL  Patient Location: Endoscopy Unit  Anesthesia Type:General  Level of Consciousness: drowsy and patient cooperative  Airway & Oxygen Therapy: Patient Spontanous Breathing and Patient connected to nasal cannula oxygen  Post-op Assessment: Report given to RN, Post -op Vital signs reviewed and stable, and Patient moving all extremities X 4  Post vital signs: Reviewed and stable  Last Vitals:  Vitals Value Taken Time  BP    Temp    Pulse 74 12/03/22 1250  Resp 20 12/03/22 1250  SpO2 100 % 12/03/22 1250  Vitals shown include unfiled device data.  Last Pain:  Vitals:   12/03/22 1229  TempSrc: Temporal  PainSc: 0-No pain         Complications: No notable events documented.

## 2022-12-03 NOTE — Op Note (Signed)
[facility] Patient Name: Curtis Scott Procedure Date: 12/03/2022 12:16 PM MRN: [id number] Account #: [id number] Date of Birth: [birth year] Admit Type: Outpatient Age: 77 Room: [facility] ENDO ROOM 3 Gender: Male Note Status: Finalized Instrument Name: Prentice Docker [id number] Procedure:             Colonoscopy Indications:           Surveillance: Personal history of colonic polyps                         (unknown histology) on last colonoscopy 5 years ago Providers:             Eather Colas MD, MD Referring MD:          No Local Md, MD (Referring MD) Medicines:             Monitored Anesthesia Care Complications:         No immediate complications. Procedure:             Pre-Anesthesia Assessment:                        - Prior to the procedure, a History and Physical was                         performed, and patient medications and allergies were                         reviewed. The patient is competent. The risks and                         benefits of the procedure and the sedation options and                         risks were discussed with the patient. All questions                         were answered and informed consent was obtained.                         Patient identification and proposed procedure were                         verified by the physician, the nurse, the                         anesthesiologist, the anesthetist and the technician                         in the endoscopy suite. Mental Status Examination:                         alert and oriented. Airway Examination: normal                         oropharyngeal airway and neck mobility. Respiratory                         Examination: clear to auscultation. CV Examination:  normal. Prophylactic Antibiotics: The patient does not                         require prophylactic antibiotics. Prior                         Anticoagulants: The patient has  taken no anticoagulant                         or antiplatelet agents. ASA Grade Assessment: II - A                         patient with mild systemic disease. After reviewing                         the risks and benefits, the patient was deemed in                         satisfactory condition to undergo the procedure. The                         anesthesia plan was to use monitored anesthesia care                         (MAC). Immediately prior to administration of                         medications, the patient was re-assessed for adequacy                         to receive sedatives. The heart rate, respiratory                         rate, oxygen saturations, blood pressure, adequacy of                         pulmonary ventilation, and response to care were                         monitored throughout the procedure. The physical                         status of the patient was re-assessed after the                         procedure.                        After obtaining informed consent, the colonoscope was                         passed under direct vision. Throughout the procedure,                         the patient's blood pressure, pulse, and oxygen                         saturations were monitored continuously. The  Colonoscope was introduced through the anus and                         advanced to the the cecum, identified by appendiceal                         orifice and ileocecal valve. The colonoscopy was                         performed without difficulty. The patient tolerated                         the procedure well. The quality of the bowel                         preparation was good. The ileocecal valve, appendiceal                         orifice, and rectum were photographed. Findings:      The perianal and digital rectal examinations were normal.      Scattered small-mouthed diverticula were found in the sigmoid colon,        descending colon, transverse colon and ascending colon.      Internal hemorrhoids were found during retroflexion. The hemorrhoids       were Grade I (internal hemorrhoids that do not prolapse).      The exam was otherwise without abnormality on direct and retroflexion       views. Impression:            - Diverticulosis in the sigmoid colon, in the                         descending colon, in the transverse colon and in the                         ascending colon.                        - Internal hemorrhoids.                        - The examination was otherwise normal on direct and                         retroflexion views.                        - No specimens collected. Recommendation:        - Discharge patient to home.                        - Resume previous diet.                        - Continue present medications.                        - Repeat colonoscopy is not recommended due to current                         age (63 years  or older) for surveillance.                        - Return to referring physician as previously                         scheduled. Procedure Code(s):     --- Professional ---                        Z6109, Colorectal cancer screening; colonoscopy on                         individual at high risk Diagnosis Code(s):     --- Professional ---                        Z86.010, Personal history of colonic polyps                        K64.0, First degree hemorrhoids                        K57.30, Diverticulosis of large intestine without                         perforation or abscess without bleeding CPT copyright 2022 American Medical Association. All rights reserved. The codes documented in this report are preliminary and upon coder review may  be revised to meet current compliance requirements. Eather Colas MD, MD 12/03/2022 12:50:55 PM Number of Addenda: 0 Note Initiated On: 12/03/2022 12:16 PM Scope Withdrawal Time: 0 hours 7 minutes 3 seconds   Total Procedure Duration: 0 hours 12 minutes 5 seconds  Estimated Blood Loss:  Estimated blood loss: none.      [facility]

## 2022-12-03 NOTE — H&P (Signed)
Outpatient short stay form Pre-procedure 12/03/2022  Regis Bill, MD  Primary Physician: [facility]  Reason for visit:  Surveillance colonoscopy  History of present illness:    77 y/o lady with history of hypertension and HLD here for surveillance colonoscopy. Had polyps on a colonoscopy over 10 years ago. Last colonoscopy 5 years ago was unremarkable but poor prep. Two uncles with colon cancer. History of cholecystectomy.    Current Facility-Administered Medications:    0.9 %  sodium chloride infusion, , Intravenous, Continuous, Amanat Hackel, Rossie Muskrat, MD  Medications Prior to Admission  Medication Sig Dispense Refill Last Dose   Multiple Vitamin (MULTIVITAMIN) LIQD Take 5 mLs by mouth daily.   Past Week   atorvastatin (LIPITOR) 20 MG tablet Take 20 mg by mouth daily. (Patient not taking: Reported on 09/17/2022)   Not Taking   losartan (COZAAR) 25 MG tablet Take 25 mg by mouth daily. (Patient not taking: Reported on 09/17/2022)   Not Taking   omeprazole (PRILOSEC) 20 MG capsule Take 20 mg by mouth daily. (Patient not taking: Reported on 12/03/2022)   Not Taking     No Known Allergies   Past Medical History:  Diagnosis Date   GERD (gastroesophageal reflux disease)     Review of systems:  Otherwise negative.    Physical Exam  Gen: Alert, oriented. Appears stated age.  HEENT: PERRLA. Lungs: No respiratory distress CV: RRR Abd: soft, benign, no masses Ext: No edema    Planned procedures: Proceed with colonoscopy. The patient understands the nature of the planned procedure, indications, risks, alternatives and potential complications including but not limited to bleeding, infection, perforation, damage to internal organs and possible oversedation/side effects from anesthesia. The patient agrees and gives consent to proceed.  Please refer to procedure notes for findings, recommendations and patient disposition/instructions.     Regis Bill,  MD [facility]

## 2022-12-06 ENCOUNTER — Encounter: Payer: Self-pay | Admitting: Gastroenterology

## 2023-11-09 ENCOUNTER — Ambulatory Visit: Payer: Medicare PPO | Admitting: Dermatology

## 2023-11-21 ENCOUNTER — Ambulatory Visit: Admitting: Dermatology

## 2023-11-21 DIAGNOSIS — D1801 Hemangioma of skin and subcutaneous tissue: Secondary | ICD-10-CM

## 2023-11-21 DIAGNOSIS — L814 Other melanin hyperpigmentation: Secondary | ICD-10-CM | POA: Diagnosis not present

## 2023-11-21 DIAGNOSIS — L570 Actinic keratosis: Secondary | ICD-10-CM

## 2023-11-21 DIAGNOSIS — Z1283 Encounter for screening for malignant neoplasm of skin: Secondary | ICD-10-CM

## 2023-11-21 DIAGNOSIS — W908XXA Exposure to other nonionizing radiation, initial encounter: Secondary | ICD-10-CM | POA: Diagnosis not present

## 2023-11-21 DIAGNOSIS — L821 Other seborrheic keratosis: Secondary | ICD-10-CM | POA: Diagnosis not present

## 2023-11-21 DIAGNOSIS — L578 Other skin changes due to chronic exposure to nonionizing radiation: Secondary | ICD-10-CM

## 2023-11-21 DIAGNOSIS — L820 Inflamed seborrheic keratosis: Secondary | ICD-10-CM

## 2023-11-21 DIAGNOSIS — D229 Melanocytic nevi, unspecified: Secondary | ICD-10-CM

## 2023-11-21 NOTE — Patient Instructions (Signed)

## 2023-11-21 NOTE — Progress Notes (Unsigned)
 Follow-Up Visit   Subjective  Curtis Scott is a 78 y.o. male who presents for the following: Skin Cancer Screening and Upper Body Skin Exam  The patient presents for Upper Body Skin Exam (UBSE) for skin cancer screening and mole check. The patient has spots, moles and lesions to be evaluated, some may be new or changing and the patient may have concern these could be cancer.  No hx skin cancer, pt does have hx of AK's. Patient with a spot at left cheek.   The following portions of the chart were reviewed this encounter and updated as appropriate: medications, allergies, medical history  Review of Systems:  No other skin or systemic complaints except as noted in HPI or Assessment and Plan.  Objective  Well appearing patient in no apparent distress; mood and affect are within normal limits.  All skin waist up examined. Relevant physical exam findings are noted in the Assessment and Plan.  L cheek x 1 Erythematous stuck-on, waxy papule or plaque L ear x 1, R ear x 1 (2) Erythematous thin papules/macules with gritty scale.   Assessment & Plan   INFLAMED SEBORRHEIC KERATOSIS L cheek x 1 Symptomatic, irritating, patient would like treated.  Benign-appearing.  Call clinic for new or changing lesions.   Destruction of lesion - L cheek x 1 Complexity: simple   Destruction method: cryotherapy   Informed consent: discussed and consent obtained   Timeout:  patient name, date of birth, surgical site, and procedure verified Lesion destroyed using liquid nitrogen: Yes   Region frozen until ice ball extended beyond lesion: Yes   Outcome: patient tolerated procedure well with no complications   Post-procedure details: wound care instructions given    AK (ACTINIC KERATOSIS) (2) L ear x 1, R ear x 1 (2) Actinic keratoses are precancerous spots that appear secondary to cumulative UV radiation exposure/sun exposure over time. They are chronic with expected duration over 1 year. A  portion of actinic keratoses will progress to squamous cell carcinoma of the skin. It is not possible to reliably predict which spots will progress to skin cancer and so treatment is recommended to prevent development of skin cancer.  Recommend daily broad spectrum sunscreen SPF 30+ to sun-exposed areas, reapply every 2 hours as needed.  Recommend staying in the shade or wearing long sleeves, sun glasses (UVA+UVB protection) and wide brim hats (4-inch brim around the entire circumference of the hat). Call for new or changing lesions. Destruction of lesion - L ear x 1, R ear x 1 (2) Complexity: simple   Destruction method: cryotherapy   Informed consent: discussed and consent obtained   Timeout:  patient name, date of birth, surgical site, and procedure verified Lesion destroyed using liquid nitrogen: Yes   Region frozen until ice ball extended beyond lesion: Yes   Outcome: patient tolerated procedure well with no complications   Post-procedure details: wound care instructions given    Skin cancer screening performed today.  Actinic Damage - Chronic condition, secondary to cumulative UV/sun exposure - diffuse scaly erythematous macules with underlying dyspigmentation - Recommend daily broad spectrum sunscreen SPF 30+ to sun-exposed areas, reapply every 2 hours as needed.  - Staying in the shade or wearing long sleeves, sun glasses (UVA+UVB protection) and wide brim hats (4-inch brim around the entire circumference of the hat) are also recommended for sun protection.  - Call for new or changing lesions.  Lentigines, Seborrheic Keratoses, Hemangiomas - Benign normal skin lesions - Benign-appearing - Call  for any changes  Melanocytic Nevi - Tan-brown and/or pink-flesh-colored symmetric macules and papules - Benign appearing on exam today - Observation - Call clinic for new or changing moles - Recommend daily use of broad spectrum spf 30+ sunscreen to sun-exposed areas.     Return in  about 1 year (around 11/20/2024) for HxAK, with Dr. MARLA, RISE.  LILLETTE Lonell Drones, RMA, am acting as scribe for Alm Rhyme, MD .   Documentation: I have reviewed the above documentation for accuracy and completeness, and I agree with the above.  Alm Rhyme, MD

## 2023-11-22 ENCOUNTER — Encounter: Payer: Self-pay | Admitting: Dermatology

## 2024-11-29 ENCOUNTER — Ambulatory Visit: Admitting: Dermatology
